# Patient Record
Sex: Female | Born: 1985 | Race: White | Hispanic: No | Marital: Married | Smoking: Never smoker
Health system: Southern US, Community
[De-identification: ages and names within clinical notes are randomized; demographics above are authoritative.]

## PROBLEM LIST (undated history)

## (undated) DIAGNOSIS — F419 Anxiety disorder, unspecified: Secondary | ICD-10-CM

## (undated) DIAGNOSIS — F32A Depression, unspecified: Secondary | ICD-10-CM

## (undated) DIAGNOSIS — Z5189 Encounter for other specified aftercare: Secondary | ICD-10-CM

## (undated) HISTORY — DX: Encounter for other specified aftercare: Z51.89

## (undated) HISTORY — PX: NASAL SINUS SURGERY: SHX719

## (undated) HISTORY — PX: BREAST ENHANCEMENT SURGERY: SHX7

---

## 2015-05-17 DIAGNOSIS — Z3403 Encounter for supervision of normal first pregnancy, third trimester: Secondary | ICD-10-CM | POA: Diagnosis not present

## 2015-05-17 DIAGNOSIS — Z3401 Encounter for supervision of normal first pregnancy, first trimester: Secondary | ICD-10-CM | POA: Diagnosis not present

## 2015-05-17 DIAGNOSIS — Z36 Encounter for antenatal screening of mother: Secondary | ICD-10-CM | POA: Diagnosis not present

## 2015-05-17 DIAGNOSIS — D509 Iron deficiency anemia, unspecified: Secondary | ICD-10-CM | POA: Diagnosis not present

## 2015-05-17 DIAGNOSIS — Z3402 Encounter for supervision of normal first pregnancy, second trimester: Secondary | ICD-10-CM | POA: Diagnosis not present

## 2015-05-17 DIAGNOSIS — Z23 Encounter for immunization: Secondary | ICD-10-CM | POA: Diagnosis not present

## 2015-05-31 ENCOUNTER — Other Ambulatory Visit (HOSPITAL_COMMUNITY): Payer: Self-pay | Admitting: Obstetrics and Gynecology

## 2015-05-31 ENCOUNTER — Encounter (HOSPITAL_COMMUNITY): Payer: Self-pay | Admitting: Obstetrics and Gynecology

## 2015-06-05 ENCOUNTER — Ambulatory Visit (HOSPITAL_COMMUNITY): Payer: Self-pay

## 2015-06-07 ENCOUNTER — Ambulatory Visit (HOSPITAL_COMMUNITY)
Admission: RE | Admit: 2015-06-07 | Discharge: 2015-06-07 | Disposition: A | Discharge status: Home / Self Care | Payer: BLUE CROSS/BLUE SHIELD | Source: Ambulatory Visit | Attending: Obstetrics and Gynecology | Admitting: Obstetrics and Gynecology

## 2015-06-07 ENCOUNTER — Encounter (HOSPITAL_COMMUNITY): Payer: Self-pay

## 2015-06-07 ENCOUNTER — Ambulatory Visit (HOSPITAL_COMMUNITY)
Admission: RE | Admit: 2015-06-07 | Discharge: 2015-06-07 | Disposition: A | Discharge status: Home / Self Care | Payer: BLUE CROSS/BLUE SHIELD | Source: Ambulatory Visit | Attending: Maternal and Fetal Medicine | Admitting: Maternal and Fetal Medicine

## 2015-06-07 DIAGNOSIS — O283 Abnormal ultrasonic finding on antenatal screening of mother: Secondary | ICD-10-CM | POA: Diagnosis not present

## 2015-06-07 DIAGNOSIS — Z315 Encounter for genetic counseling: Secondary | ICD-10-CM | POA: Insufficient documentation

## 2015-06-07 DIAGNOSIS — O09899 Supervision of other high risk pregnancies, unspecified trimester: Secondary | ICD-10-CM | POA: Insufficient documentation

## 2015-06-07 DIAGNOSIS — Z3A14 14 weeks gestation of pregnancy: Secondary | ICD-10-CM | POA: Diagnosis not present

## 2015-06-07 DIAGNOSIS — O280 Abnormal hematological finding on antenatal screening of mother: Secondary | ICD-10-CM | POA: Insufficient documentation

## 2015-06-07 HISTORY — DX: Abnormal hematological finding on antenatal screening of mother: O28.0

## 2015-06-07 HISTORY — DX: Supervision of other high risk pregnancies, unspecified trimester: O09.899

## 2015-06-07 NOTE — Progress Notes (Signed)
Genetic Counseling  High-Risk Gestation Note  Appointment Date:  06/07/2015 Referred By: [name], MD Date of Birth:  08-28-1985 Partner:  Gaetano Net   Pregnancy History: G1P0 Estimated Date of Delivery: 12/02/15 Estimated Gestational Age: [redacted]w[redacted]d Attending: Alpha Gula, MD   Mrs. [name] and her husband, Mr. Azizi Bally, were seen for genetic counseling because of an increased risk for fetal Down syndrome based on first trimester screening through [facility].  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  We reviewed chromosomes, nondisjunction, and the common features and variable prognosis of Down syndrome.  In addition, we reviewed the screen adjusted reduction in risks for trisomy 18/13 (1 in 913  to < 1 in 10,000).  We also discussed other explanations for a screen positive result including: a gestational dating error, differences in maternal metabolism, and normal variation. They understand that this screening is not diagnostic for Down syndrome but provides a risk assessment.  We specifically discussed that the level of one of the proteins analyzed on the screen, PAPP-A, was very low (0.28 MoM).  This has been associated with an increased risk for growth restriction, preeclampsia, poor pregnancy outcome later in pregnancy; therefore, we would recommend a follow up ultrasound for fetal growth in the third trimester.  We reviewed available screening  options including noninvasive prenatal screening (NIPS)/prenatal cell free DNA (cfDNA) testing, and detailed ultrasound.  They were counseled that screening tests are used to modify a patient's a priori risk for aneuploidy, typically based on age. This estimate provides a pregnancy specific risk assessment. We reviewed the benefits and limitations of each option. Specifically, we discussed the conditions for which each test screens, the detection rates, and false positive rates of each. They were also counseled regarding diagnostic testing via amniocentesis. We reviewed the approximate 1 in 300-500 risk for complications for amniocentesis, including spontaneous pregnancy loss.   After consideration of all the options, they elected to proceed with NIPS (Panorama through [facility]) today.  Those results will be available in 8-10 days.  We discussed the option of detailed ultrasound at approximately [redacted] weeks gestation. The patient reported that an anatomy ultrasound is scheduled in her OB office.  Diagnostic testing was declined today.  They understand that screening tests cannot rule out all birth defects or genetic syndromes. The patient was advised of this limitation.   Mrs. [name] was provided with written information regarding cystic fibrosis (CF) including the carrier frequency and incidence in the Caucasian population, the availability of carrier testing and prenatal diagnosis if indicated.  In addition, we discussed that CF is routinely screened for as part of the  newborn screening panel. CF carrier screening was performed through her OB office and was negative for the mutations analyzed. Thus, her risk to be a CF carrier has been significantly reduced.   Both family histories were reviewed and found to be contributory for spina bifida for Mr. Simao's maternal great-uncle. We discussed that spina bifida is a form of  an open neural tube defect (NTD), and affected approximately 1 in  500 births. NTDs occur as an isolated finding, in the majority of cases and are usually inherited in a multifactorial manner in which there is no prior family history.  Multifactorial conditions have both environmental and genetic factors that contribute to their development.  Both the genetic and environmental factors that contribute to the development of spina bifida are largely unknown; however, some medications and health conditions, such as uncontrolled diabetes and obesity, may increase the chance of spina bifida.  We also discussed that NTDs may occur as a feature of an underlying genetic syndrome or condition.  Approximately 5-10% of individuals who have spina bifida also have an underlying chromosome condition.  Without additional information, it is difficult to determine a specific etiology.  However, given the reported family history, recurrence risk for the current pregnancy would not be expected to be increased above the general population risk, in the case of multifactorial inheritance. We reviewed prenatal screening options for detection of NTDs, including MSAFP and ultrasound. Without further information regarding the provided family history, an accurate genetic risk cannot be calculated. Further genetic counseling is warranted if more information is obtained.  Mrs. [name] denied exposure to environmental toxins or chemical agents. She denied the use of alcohol, tobacco or street drugs. She denied significant viral illnesses during the course of her pregnancy. Her medical and surgical histories were contributory for history of blood transfusion as a newborn given her mother's Rh negative blood status.  She reported recent symptoms of UTI and planned to contact her OB provider regarding these concerns.   I counseled this couple for approximately 45 minutes regarding the above risks and available options.   Quinn Plowman, MS,  Certified Genetic Counselor 06/07/2015

## 2015-06-12 ENCOUNTER — Other Ambulatory Visit (HOSPITAL_COMMUNITY): Payer: Self-pay

## 2015-06-12 ENCOUNTER — Encounter (HOSPITAL_COMMUNITY): Payer: Self-pay

## 2015-06-14 ENCOUNTER — Telehealth (HOSPITAL_COMMUNITY): Payer: Self-pay | Admitting: MS"

## 2015-06-14 NOTE — Telephone Encounter (Signed)
Called Kristine Graves to discuss her prenatal cell free DNA test results.  Mrs. Kristine Graves had Panorama testing through [facility].  Testing was offered because of Down syndrome risk from first trimester screen.   The patient was identified by name and DOB.  We reviewed that these are within normal limits, showing a less than 1 in 10,000 risk for trisomies 21, 18 and 13, and monosomy X (Turner syndrome).  In addition, the risk for triploidy/vanishing twin and sex chromosome trisomies (47,XXX and 47,XXY) was also low risk. We reviewed that this testing identifies > 99% of pregnancies with trisomy 68, trisomy 35, sex chromosome trisomies (47,XXX and 47,XXY), and triploidy. The detection rate for trisomy 18 is 96%.  The detection rate for monosomy X is ~92%.  The false positive rate is <0.1% for all conditions. Fetal sex was not disclosed on the results, per the patient's request.  She understands that this testing does not identify all genetic conditions.  All questions were answered to her satisfaction, she was encouraged to call with additional questions or concerns.  Quinn Plowman, MS Certified Genetic Counselor 06/14/2015 9:03 AM

## 2015-06-19 ENCOUNTER — Other Ambulatory Visit (HOSPITAL_COMMUNITY): Payer: Self-pay

## 2015-07-10 ENCOUNTER — Inpatient Hospital Stay (HOSPITAL_COMMUNITY)
Admission: AD | Admit: 2015-07-10 | Payer: BLUE CROSS/BLUE SHIELD | Source: Ambulatory Visit | Admitting: Obstetrics and Gynecology

## 2015-08-30 DIAGNOSIS — Z36 Encounter for antenatal screening of mother: Secondary | ICD-10-CM | POA: Diagnosis not present

## 2015-08-30 DIAGNOSIS — Z23 Encounter for immunization: Secondary | ICD-10-CM | POA: Diagnosis not present

## 2015-08-30 DIAGNOSIS — D509 Iron deficiency anemia, unspecified: Secondary | ICD-10-CM | POA: Diagnosis not present

## 2015-10-04 ENCOUNTER — Ambulatory Visit (INDEPENDENT_AMBULATORY_CARE_PROVIDER_SITE_OTHER): Payer: Self-pay | Admitting: Pediatrics

## 2015-10-04 DIAGNOSIS — Z7681 Expectant parent(s) prebirth pediatrician visit: Secondary | ICD-10-CM

## 2015-10-04 DIAGNOSIS — Z3490 Encounter for supervision of normal pregnancy, unspecified, unspecified trimester: Secondary | ICD-10-CM

## 2015-10-04 NOTE — Progress Notes (Signed)
Prenatal counseling for impending newborn done-- Z76.81  

## 2015-11-01 DIAGNOSIS — Z36 Encounter for antenatal screening of mother: Secondary | ICD-10-CM | POA: Diagnosis not present

## 2015-11-01 DIAGNOSIS — O3663X0 Maternal care for excessive fetal growth, third trimester, not applicable or unspecified: Secondary | ICD-10-CM | POA: Diagnosis not present

## 2015-11-01 DIAGNOSIS — Z3A35 35 weeks gestation of pregnancy: Secondary | ICD-10-CM | POA: Diagnosis not present

## 2015-11-02 ENCOUNTER — Other Ambulatory Visit (HOSPITAL_COMMUNITY): Payer: Self-pay | Admitting: Obstetrics and Gynecology

## 2015-11-02 ENCOUNTER — Ambulatory Visit (HOSPITAL_COMMUNITY)
Admission: RE | Admit: 2015-11-02 | Discharge: 2015-11-02 | Disposition: A | Discharge status: Home / Self Care | Payer: BLUE CROSS/BLUE SHIELD | Source: Ambulatory Visit | Attending: Obstetrics and Gynecology | Admitting: Obstetrics and Gynecology

## 2015-11-02 ENCOUNTER — Encounter (HOSPITAL_COMMUNITY): Payer: Self-pay

## 2015-11-02 VITALS — BP 121/85 | HR 82 | Wt 167.0 lb

## 2015-11-02 DIAGNOSIS — O289 Unspecified abnormal findings on antenatal screening of mother: Secondary | ICD-10-CM | POA: Diagnosis not present

## 2015-11-02 DIAGNOSIS — O359XX0 Maternal care for (suspected) fetal abnormality and damage, unspecified, not applicable or unspecified: Secondary | ICD-10-CM | POA: Insufficient documentation

## 2015-11-02 DIAGNOSIS — Z3A35 35 weeks gestation of pregnancy: Secondary | ICD-10-CM

## 2015-11-02 DIAGNOSIS — O09899 Supervision of other high risk pregnancies, unspecified trimester: Secondary | ICD-10-CM

## 2015-11-02 DIAGNOSIS — Z3689 Encounter for other specified antenatal screening: Secondary | ICD-10-CM

## 2015-11-02 DIAGNOSIS — Z36 Encounter for antenatal screening of mother: Secondary | ICD-10-CM | POA: Diagnosis not present

## 2015-11-02 DIAGNOSIS — O280 Abnormal hematological finding on antenatal screening of mother: Secondary | ICD-10-CM

## 2015-11-07 DIAGNOSIS — Z3A36 36 weeks gestation of pregnancy: Secondary | ICD-10-CM | POA: Diagnosis not present

## 2015-11-07 DIAGNOSIS — O358XX0 Maternal care for other (suspected) fetal abnormality and damage, not applicable or unspecified: Secondary | ICD-10-CM | POA: Diagnosis not present

## 2015-11-14 DIAGNOSIS — O358XX1 Maternal care for other (suspected) fetal abnormality and damage, fetus 1: Secondary | ICD-10-CM | POA: Diagnosis not present

## 2015-11-14 DIAGNOSIS — O289 Unspecified abnormal findings on antenatal screening of mother: Secondary | ICD-10-CM | POA: Diagnosis not present

## 2015-11-14 DIAGNOSIS — O0973 Supervision of high risk pregnancy due to social problems, third trimester: Secondary | ICD-10-CM | POA: Diagnosis not present

## 2015-11-14 DIAGNOSIS — O403XX0 Polyhydramnios, third trimester, not applicable or unspecified: Secondary | ICD-10-CM | POA: Diagnosis not present

## 2015-11-14 DIAGNOSIS — Z3A37 37 weeks gestation of pregnancy: Secondary | ICD-10-CM | POA: Diagnosis not present

## 2015-11-14 DIAGNOSIS — O358XX0 Maternal care for other (suspected) fetal abnormality and damage, not applicable or unspecified: Secondary | ICD-10-CM | POA: Diagnosis not present

## 2015-11-21 ENCOUNTER — Encounter (HOSPITAL_COMMUNITY): Payer: Self-pay

## 2015-11-21 ENCOUNTER — Other Ambulatory Visit (HOSPITAL_COMMUNITY): Payer: Self-pay

## 2015-11-21 DIAGNOSIS — Z3A38 38 weeks gestation of pregnancy: Secondary | ICD-10-CM | POA: Diagnosis not present

## 2015-11-21 DIAGNOSIS — O403XX0 Polyhydramnios, third trimester, not applicable or unspecified: Secondary | ICD-10-CM | POA: Diagnosis not present

## 2015-11-21 DIAGNOSIS — O409XX0 Polyhydramnios, unspecified trimester, not applicable or unspecified: Secondary | ICD-10-CM | POA: Diagnosis not present

## 2015-11-21 DIAGNOSIS — O358XX0 Maternal care for other (suspected) fetal abnormality and damage, not applicable or unspecified: Secondary | ICD-10-CM | POA: Diagnosis not present

## 2015-11-21 DIAGNOSIS — Z3A39 39 weeks gestation of pregnancy: Secondary | ICD-10-CM | POA: Diagnosis not present

## 2015-11-21 DIAGNOSIS — Z36 Encounter for antenatal screening of mother: Secondary | ICD-10-CM | POA: Diagnosis not present

## 2015-11-25 DIAGNOSIS — Q410 Congenital absence, atresia and stenosis of duodenum: Secondary | ICD-10-CM | POA: Diagnosis not present

## 2015-11-25 DIAGNOSIS — O358XX0 Maternal care for other (suspected) fetal abnormality and damage, not applicable or unspecified: Secondary | ICD-10-CM | POA: Diagnosis not present

## 2015-11-25 DIAGNOSIS — Z3403 Encounter for supervision of normal first pregnancy, third trimester: Secondary | ICD-10-CM | POA: Diagnosis not present

## 2015-11-25 DIAGNOSIS — Z3A39 39 weeks gestation of pregnancy: Secondary | ICD-10-CM | POA: Diagnosis not present

## 2015-11-25 DIAGNOSIS — O403XX0 Polyhydramnios, third trimester, not applicable or unspecified: Secondary | ICD-10-CM | POA: Diagnosis not present

## 2015-11-25 DIAGNOSIS — O401XX0 Polyhydramnios, first trimester, not applicable or unspecified: Secondary | ICD-10-CM | POA: Diagnosis not present

## 2015-11-25 DIAGNOSIS — Z3A00 Weeks of gestation of pregnancy not specified: Secondary | ICD-10-CM | POA: Diagnosis not present

## 2015-11-26 DIAGNOSIS — O403XX0 Polyhydramnios, third trimester, not applicable or unspecified: Secondary | ICD-10-CM | POA: Diagnosis not present

## 2015-11-26 DIAGNOSIS — Q410 Congenital absence, atresia and stenosis of duodenum: Secondary | ICD-10-CM | POA: Diagnosis not present

## 2015-11-26 DIAGNOSIS — O401XX0 Polyhydramnios, first trimester, not applicable or unspecified: Secondary | ICD-10-CM | POA: Diagnosis not present

## 2015-11-26 DIAGNOSIS — Z3A39 39 weeks gestation of pregnancy: Secondary | ICD-10-CM | POA: Diagnosis not present

## 2015-12-19 ENCOUNTER — Ambulatory Visit (HOSPITAL_COMMUNITY)
Admission: RE | Admit: 2015-12-19 | Discharge: 2015-12-19 | Disposition: A | Discharge status: Home / Self Care | Payer: BLUE CROSS/BLUE SHIELD | Source: Ambulatory Visit | Attending: Obstetrics and Gynecology | Admitting: Obstetrics and Gynecology

## 2015-12-19 NOTE — Lactation Note (Signed)
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  Mother pumped after birth and baby has been breastfeeding for one week now.  Mother has implants - transaxillary incisions.  Mother's nipples are sore, pink and has a shooting pain from right breast.  Baby did have white coating on tongue.  Referred her to Peds and OB/Gyn for possible thrush treatment and A.P.N.O.  Right breast has crack with dried blood.  Baby has tight labial frenulum contributing to difficulty flanging upper lip.  Also noted that baby tucks bottom lip in when latching.  Taught mother how to flange lips when latching.  Baby latches easily in cross cradle hold.  Helped mother achieve a deeper latch and turn baby toward mother so she feeds tummy to tummy.  Baby transferred approx 84ml in a feeding.  Mother will be going back to work at 12 weeks so suggest she continue pumping once per day or every other day to keep baby use to bottle.  Change breast pads often and gave her information about yeast protocol and treatment.   Consult:  Initial Lactation Consultant:  Hardie PulleyBerkelhammer, Jams Trickett Boschen  ________________________________________________________________________ _______________________________________________________________________  Mother's Name: Kristine SpindleSara Graves Like Type of delivery:   Breastfeeding Experience:  primip  Maternal Medications:  PNV occasionally motrin ________________________________________________________________________  Breastfeeding History (Post Discharge) Frequency of breastfeeding:  6-8 per day Duration of feeding:  15-30 min Pumping  Type of pump:  Medela pump in style Frequency:  1x per day Volume:  2-3 ounces   Infant Intake and Output Assessment  Voids:  3-5 in 24 hrs.  Color:  Clear yellow Stools:  4-5 in 24 hrs.  Color:   Yellow  ________________________________________________________________________  Maternal Breast Assessment  Breast:  Filling Nipple:  Erect Pain level:  1 Pain interventions:  Comfort gels, All purpose nipple cream and Cold packs  _______________________________________________________________________ Feeding Assessment/Evaluation  Initial feeding assessment:  Infant's oral assessment:  Variance  Positioning:  Cross cradle Left breast  LATCH documentation:  Latch:  2 = Grasps breast easily, tongue down, lips flanged, rhythmical sucking.  Audible swallowing:  2 = Spontaneous and intermittent  Type of nipple:  2 = Everted at rest and after stimulation  Comfort (Breast/Nipple):  1 = Filling, red/small blisters or bruises, mild/mod discomfort  Hold (Positioning):  2 = No assistance needed to correctly position infant at breast  LATCH score:  9  Attached assessment:  Shallow  Lips flanged:  Yes.    Lips untucked:  Yes.    Suck assessment:  Nutritive  Tools:  Shells Instructed on use and cleaning of tool:  Yes.    Pre-feed weight:  3318 g   Post-feed weight:  3388 g  Amount transferred:  70 ml  Additional Feeding Assessment -   Infant's oral assessment:  Variance  Positioning:  Cross cradle Right breast  LATCH documentation:  Latch:  2 = Grasps breast easily, tongue down, lips flanged, rhythmical sucking.  Audible swallowing:  1 = A few with stimulation  Type of nipple:  2 = Everted at rest and after stimulation  Comfort (Breast/Nipple):  1 = Filling, red/small blisters or bruises, mild/mod discomfort  Hold (Positioning):  1 = Assistance needed to correctly position infant at breast and maintain latch  LATCH score:  7  Attached assessment:  Shallow  Lips flanged:  Yes.  Lips untucked:  Yes.    Suck assessment:  Displays both  Tools:  Pump Instructed on use and cleaning of tool:  Yes.    Pre-feed weight:  3388 g   Post-feed weight:  3402 g  Amount  transferred:  84 ml  Total amount transferred:  84 ml

## 2015-12-21 DIAGNOSIS — O9213 Cracked nipple associated with lactation: Secondary | ICD-10-CM | POA: Diagnosis not present

## 2016-03-13 DIAGNOSIS — H6692 Otitis media, unspecified, left ear: Secondary | ICD-10-CM | POA: Diagnosis not present

## 2016-03-24 ENCOUNTER — Encounter: Payer: Self-pay | Admitting: Pediatrics

## 2016-04-11 ENCOUNTER — Encounter (HOSPITAL_COMMUNITY): Payer: Self-pay

## 2016-04-25 ENCOUNTER — Ambulatory Visit (HOSPITAL_COMMUNITY)
Admission: RE | Admit: 2016-04-25 | Discharge: 2016-04-25 | Disposition: A | Discharge status: Home / Self Care | Payer: BLUE CROSS/BLUE SHIELD | Source: Ambulatory Visit | Attending: Obstetrics and Gynecology | Admitting: Obstetrics and Gynecology

## 2016-04-25 NOTE — Lactation Note (Signed)
Lactation Consult for Kristine Graves (mother) and Kristine Graves ([birth year])  Mother's reason for visit: milk supply issues & slow weight gain Consult:  Initial Lactation Consultant:  Remigio Eisenmengerichey, Brittnay Pigman Hamilton  ________________________________________________________________________ BW: 6# 13oz 04-24-16: 11# 9 oz Today's weight: pre- & post-weights were not done ____________________________________________________________________  Mother's Name: [name] Richins Breastfeeding Experience: primip Maternal Medical Conditions:  Breast augmentation in 2012 Maternal Medications: fenugreek (therapeutic dosage for 2 weeks, but no help); More Milk Plus Mother's milk tea; Brewer's yeast; OCP (progestin-only) since 6 weeks postpartum; PNV  ________________________________________________________________________  Breastfeeding History (Post Discharge)  Frequency of breastfeeding: tid Duration of feeding: 30-45 min  Pumping  Type of pump:  Medela pump in style Frequency:  6 times/day x 20-30 minutes Volume: 2-5 oz/session   Infant Intake and Output Assessment  Voids: 6-10 in 24 hrs.  Color:  Clear yellow Stools: 0-2 in 24 hrs.  Color:  Green and Yellow  ________________________________________________________________________  Maternal Breast Assessment  Breast:  implants Nipple:  Erect  _______________________________________________________________________ Feeding Assessment/Evaluation  Initial feeding assessment: Not assessed. Infant slept through consult.   Kristine Graves was born at 2439 weeks & is 705 months old. She had surgery for duodenal atresia early in the newborn period, which mother reports was successful. She is at the 1st percentile on the growth curve (at her 2 month check-up she was at the 3rd percentile. There was no weight gain between 172 & 383 months of age).    Mom had implants in 2012. Her anatomy prior to surgery were simply smaller breasts (there was no wide spacing or  asymmetry). Mom reports that her breasts got bigger during pregnancy, but her breast size did not increase by a cup size or more.   Mom has been using size 27 flanges for pumping, which are too large for her. She was sized for flanges, initially trying a 21, which were comfortable (but showed blanching at nipple base). There was considerably less blanching with the size 24 flanges. Mom was taught hand expression so that she can add 2-3 minutes to each breast at the end of pumping to obtain higher-fat milk & increase her volume. Mom's nipples and areola are pink, but Mom states that is typical for her. She does not have complaints of breast pain.   Mom has tried fenugreek at a therapeutic dosage for 2 weeks, but found no change in her supply. Mom might consider an herbal galactagogue like moringa/malunggay. However, mother is amenable to supplementing with formula. Mom has felt a lot of pressure (self-imposed or cultural) to pump/feed a certain amount each day, which is a little less than what Kristine Graves needs for good growth. I suggested that she begin to take formula to daycare so that the daycare providers can offer formula if Mom was unable to pump enough. Beginning to supplement with formula may also allow Mom a longer stretch of sleep at night, as she has been getting up at 3 or 4 am to pump (while Kristine Graves continues to sleep).   Next f/u w/Peds is at the end of January, but Mom states she may come to our Monday-evening BFSG to continue monitoring Audrey's weight.   Glenetta HewKim Ancelmo Hunt, RN, IBCLC

## 2016-05-27 DIAGNOSIS — R6889 Other general symptoms and signs: Secondary | ICD-10-CM | POA: Diagnosis not present

## 2016-09-18 DIAGNOSIS — N926 Irregular menstruation, unspecified: Secondary | ICD-10-CM | POA: Diagnosis not present

## 2016-10-02 DIAGNOSIS — H6690 Otitis media, unspecified, unspecified ear: Secondary | ICD-10-CM | POA: Diagnosis not present

## 2016-10-10 DIAGNOSIS — J0100 Acute maxillary sinusitis, unspecified: Secondary | ICD-10-CM | POA: Diagnosis not present

## 2016-11-06 DIAGNOSIS — J029 Acute pharyngitis, unspecified: Secondary | ICD-10-CM | POA: Diagnosis not present

## 2017-03-18 DIAGNOSIS — Z01419 Encounter for gynecological examination (general) (routine) without abnormal findings: Secondary | ICD-10-CM | POA: Diagnosis not present

## 2017-03-18 DIAGNOSIS — Z6820 Body mass index (BMI) 20.0-20.9, adult: Secondary | ICD-10-CM | POA: Diagnosis not present

## 2017-10-13 DIAGNOSIS — R5383 Other fatigue: Secondary | ICD-10-CM | POA: Diagnosis not present

## 2017-10-13 DIAGNOSIS — G479 Sleep disorder, unspecified: Secondary | ICD-10-CM | POA: Diagnosis not present

## 2017-10-13 DIAGNOSIS — F419 Anxiety disorder, unspecified: Secondary | ICD-10-CM | POA: Diagnosis not present

## 2017-10-13 DIAGNOSIS — F430 Acute stress reaction: Secondary | ICD-10-CM | POA: Diagnosis not present

## 2017-12-04 DIAGNOSIS — N911 Secondary amenorrhea: Secondary | ICD-10-CM | POA: Diagnosis not present

## 2017-12-09 DIAGNOSIS — Z3481 Encounter for supervision of other normal pregnancy, first trimester: Secondary | ICD-10-CM | POA: Diagnosis not present

## 2017-12-09 DIAGNOSIS — Z13228 Encounter for screening for other metabolic disorders: Secondary | ICD-10-CM | POA: Diagnosis not present

## 2017-12-09 DIAGNOSIS — Z3685 Encounter for antenatal screening for Streptococcus B: Secondary | ICD-10-CM | POA: Diagnosis not present

## 2017-12-09 LAB — OB RESULTS CONSOLE RUBELLA ANTIBODY, IGM: Rubella: IMMUNE

## 2017-12-09 LAB — OB RESULTS CONSOLE ANTIBODY SCREEN: Antibody Screen: NEGATIVE

## 2017-12-09 LAB — OB RESULTS CONSOLE HIV ANTIBODY (ROUTINE TESTING): HIV: NONREACTIVE

## 2017-12-09 LAB — OB RESULTS CONSOLE GC/CHLAMYDIA
CHLAMYDIA, DNA PROBE: NEGATIVE
GC PROBE AMP, GENITAL: NEGATIVE

## 2017-12-09 LAB — OB RESULTS CONSOLE RPR: RPR: NONREACTIVE

## 2017-12-09 LAB — OB RESULTS CONSOLE ABO/RH: RH TYPE: POSITIVE

## 2017-12-09 LAB — OB RESULTS CONSOLE HEPATITIS B SURFACE ANTIGEN: Hepatitis B Surface Ag: NEGATIVE

## 2018-01-07 DIAGNOSIS — Z113 Encounter for screening for infections with a predominantly sexual mode of transmission: Secondary | ICD-10-CM | POA: Diagnosis not present

## 2018-01-07 DIAGNOSIS — Z3400 Encounter for supervision of normal first pregnancy, unspecified trimester: Secondary | ICD-10-CM | POA: Diagnosis not present

## 2018-01-14 DIAGNOSIS — Z3682 Encounter for antenatal screening for nuchal translucency: Secondary | ICD-10-CM | POA: Diagnosis not present

## 2018-01-14 DIAGNOSIS — Z3A13 13 weeks gestation of pregnancy: Secondary | ICD-10-CM | POA: Diagnosis not present

## 2018-02-02 DIAGNOSIS — Z3480 Encounter for supervision of other normal pregnancy, unspecified trimester: Secondary | ICD-10-CM | POA: Diagnosis not present

## 2018-02-18 DIAGNOSIS — Z363 Encounter for antenatal screening for malformations: Secondary | ICD-10-CM | POA: Diagnosis not present

## 2018-02-18 DIAGNOSIS — Z23 Encounter for immunization: Secondary | ICD-10-CM | POA: Diagnosis not present

## 2018-03-16 DIAGNOSIS — Z3480 Encounter for supervision of other normal pregnancy, unspecified trimester: Secondary | ICD-10-CM | POA: Diagnosis not present

## 2018-04-14 DIAGNOSIS — Z23 Encounter for immunization: Secondary | ICD-10-CM | POA: Diagnosis not present

## 2018-04-14 DIAGNOSIS — Z3480 Encounter for supervision of other normal pregnancy, unspecified trimester: Secondary | ICD-10-CM | POA: Diagnosis not present

## 2018-04-28 DIAGNOSIS — O26893 Other specified pregnancy related conditions, third trimester: Secondary | ICD-10-CM | POA: Diagnosis not present

## 2018-04-28 DIAGNOSIS — Z3A28 28 weeks gestation of pregnancy: Secondary | ICD-10-CM | POA: Diagnosis not present

## 2018-05-24 DIAGNOSIS — O3663X0 Maternal care for excessive fetal growth, third trimester, not applicable or unspecified: Secondary | ICD-10-CM | POA: Diagnosis not present

## 2018-05-24 DIAGNOSIS — Z3A31 31 weeks gestation of pregnancy: Secondary | ICD-10-CM | POA: Diagnosis not present

## 2018-06-22 DIAGNOSIS — Z3685 Encounter for antenatal screening for Streptococcus B: Secondary | ICD-10-CM | POA: Diagnosis not present

## 2018-07-02 DIAGNOSIS — Z3688 Encounter for antenatal screening for fetal macrosomia: Secondary | ICD-10-CM | POA: Diagnosis not present

## 2018-07-02 DIAGNOSIS — Z3A37 37 weeks gestation of pregnancy: Secondary | ICD-10-CM | POA: Diagnosis not present

## 2018-07-05 ENCOUNTER — Telehealth (HOSPITAL_COMMUNITY): Payer: Self-pay | Admitting: *Deleted

## 2018-07-05 ENCOUNTER — Encounter (HOSPITAL_COMMUNITY): Payer: Self-pay | Admitting: *Deleted

## 2018-07-05 NOTE — Telephone Encounter (Signed)
Preadmission screen  

## 2018-07-12 ENCOUNTER — Telehealth (HOSPITAL_COMMUNITY): Payer: Self-pay | Admitting: *Deleted

## 2018-07-12 ENCOUNTER — Encounter (HOSPITAL_COMMUNITY): Payer: Self-pay | Admitting: *Deleted

## 2018-07-12 NOTE — Telephone Encounter (Signed)
Preadmission screen  

## 2018-07-12 NOTE — H&P (Signed)
Kristine Graves is a 33 y.o. female presenting for IOL.  Pregnancy uncomplicated.  OB History    Gravida  2   Para  1   Term  1   Preterm      AB      Living  1     SAB      TAB      Ectopic      Multiple      Live Births  1          Past Medical History:  Diagnosis Date  . Blood transfusion without reported diagnosis    at birth RH incompatibility   Past Surgical History:  Procedure Laterality Date  . BREAST ENHANCEMENT SURGERY    . NASAL SINUS SURGERY     Family History: family history includes Diabetes in her maternal grandfather and paternal grandfather; Kristine Graves' disease in her maternal grandmother; Heart attack in her paternal grandfather; Hypertension in her father. Social History:  reports that she has never smoked. She has never used smokeless tobacco. She reports previous alcohol use. She reports that she does not use drugs.     Maternal Diabetes: No Genetic Screening: Normal Maternal Ultrasounds/Referrals: Normal Fetal Ultrasounds or other Referrals:  None Maternal Substance Abuse:  No Significant Maternal Medications:  None Significant Maternal Lab Results:  None Other Comments:  None  ROS History   Last menstrual period 10/13/2017, unknown if currently breastfeeding. Exam Physical Exam  Prenatal labs: ABO, Rh: O/Positive/-- (07/31 0000) Antibody: Negative (07/31 0000) Rubella: Immune (07/31 0000) RPR: Nonreactive (07/31 0000)  HBsAg: Negative (07/31 0000)  HIV: Non-reactive (07/31 0000)  GBS:   negative  Assessment/Plan: Admit Pitocin IOL Epidural prn   [name] 07/12/2018, 9:00 PM

## 2018-07-13 ENCOUNTER — Inpatient Hospital Stay (HOSPITAL_COMMUNITY): Payer: BLUE CROSS/BLUE SHIELD | Admitting: Anesthesiology

## 2018-07-13 ENCOUNTER — Inpatient Hospital Stay (HOSPITAL_COMMUNITY)
Admission: RE | Admit: 2018-07-13 | Discharge: 2018-07-14 | DRG: 807 | Disposition: A | Discharge status: Home / Self Care | Payer: BLUE CROSS/BLUE SHIELD | Attending: Obstetrics and Gynecology | Admitting: Obstetrics and Gynecology

## 2018-07-13 ENCOUNTER — Inpatient Hospital Stay (HOSPITAL_COMMUNITY)
Admission: RE | Admit: 2018-07-13 | Discharge: 2018-07-13 | Disposition: A | Discharge status: Home / Self Care | Payer: BLUE CROSS/BLUE SHIELD | Source: Ambulatory Visit | Attending: Obstetrics and Gynecology | Admitting: Obstetrics and Gynecology

## 2018-07-13 ENCOUNTER — Encounter (HOSPITAL_COMMUNITY): Payer: BLUE CROSS/BLUE SHIELD

## 2018-07-13 ENCOUNTER — Encounter (HOSPITAL_COMMUNITY): Payer: Self-pay | Admitting: *Deleted

## 2018-07-13 DIAGNOSIS — Z3A39 39 weeks gestation of pregnancy: Secondary | ICD-10-CM | POA: Diagnosis not present

## 2018-07-13 DIAGNOSIS — Z23 Encounter for immunization: Secondary | ICD-10-CM | POA: Diagnosis not present

## 2018-07-13 DIAGNOSIS — O26893 Other specified pregnancy related conditions, third trimester: Secondary | ICD-10-CM | POA: Diagnosis not present

## 2018-07-13 LAB — CBC
HCT: 37.7 % (ref 36.0–46.0)
Hemoglobin: 12.4 g/dL (ref 12.0–15.0)
MCH: 30.6 pg (ref 26.0–34.0)
MCHC: 32.9 g/dL (ref 30.0–36.0)
MCV: 93.1 fL (ref 80.0–100.0)
Platelets: 141 10*3/uL — ABNORMAL LOW (ref 150–400)
RBC: 4.05 MIL/uL (ref 3.87–5.11)
RDW: 13.1 % (ref 11.5–15.5)
WBC: 6.7 10*3/uL (ref 4.0–10.5)
nRBC: 0 % (ref 0.0–0.2)

## 2018-07-13 LAB — TYPE AND SCREEN
ABO/RH(D): O POS
Antibody Screen: NEGATIVE

## 2018-07-13 LAB — OB RESULTS CONSOLE GBS: GBS: NEGATIVE

## 2018-07-13 LAB — ABO/RH: ABO/RH(D): O POS

## 2018-07-13 LAB — RPR: RPR Ser Ql: NONREACTIVE

## 2018-07-13 MED ORDER — LIDOCAINE HCL (PF) 1 % IJ SOLN
INTRAMUSCULAR | Status: DC | PRN
  Administered 2018-07-13: 5 mL via EPIDURAL
  Administered 2018-07-13: 4 mL via EPIDURAL

## 2018-07-13 MED ORDER — LACTATED RINGERS IV SOLN: 500.0000 mL | Freq: Once | INTRAVENOUS | Status: DC

## 2018-07-13 MED ORDER — OXYTOCIN BOLUS FROM INFUSION
500.0000 mL | Freq: Once | INTRAVENOUS | Status: AC
  Administered 2018-07-13: 500 mL via INTRAVENOUS

## 2018-07-13 MED ORDER — FENTANYL-BUPIVACAINE-NACL 0.5-0.125-0.9 MG/250ML-% EP SOLN
12.0000 mL/h | EPIDURAL | Status: DC | PRN
  Filled 2018-07-13: qty 250

## 2018-07-13 MED ORDER — ACETAMINOPHEN 325 MG PO TABS: 650.0000 mg | ORAL_TABLET | ORAL | Status: DC | PRN

## 2018-07-13 MED ORDER — LACTATED RINGERS IV SOLN
INTRAVENOUS | Status: DC
  Administered 2018-07-13 (×2): via INTRAVENOUS

## 2018-07-13 MED ORDER — MEASLES, MUMPS & RUBELLA VAC IJ SOLR: 0.5000 mL | Freq: Once | INTRAMUSCULAR | Status: DC

## 2018-07-13 MED ORDER — OXYCODONE-ACETAMINOPHEN 5-325 MG PO TABS: 2.0000 | ORAL_TABLET | ORAL | Status: DC | PRN

## 2018-07-13 MED ORDER — TETANUS-DIPHTH-ACELL PERTUSSIS 5-2.5-18.5 LF-MCG/0.5 IM SUSP: 0.5000 mL | Freq: Once | INTRAMUSCULAR | Status: DC

## 2018-07-13 MED ORDER — BENZOCAINE-MENTHOL 20-0.5 % EX AERO
1.0000 "application " | INHALATION_SPRAY | CUTANEOUS | Status: DC | PRN
  Administered 2018-07-13: 1 via TOPICAL
  Filled 2018-07-13: qty 56

## 2018-07-13 MED ORDER — DIPHENHYDRAMINE HCL 25 MG PO CAPS: 25.0000 mg | ORAL_CAPSULE | Freq: Four times a day (QID) | ORAL | Status: DC | PRN

## 2018-07-13 MED ORDER — DIBUCAINE 1 % RE OINT: 1.0000 "application " | TOPICAL_OINTMENT | RECTAL | Status: DC | PRN

## 2018-07-13 MED ORDER — EPHEDRINE 5 MG/ML INJ: 10.0000 mg | INTRAVENOUS | Status: DC | PRN

## 2018-07-13 MED ORDER — SIMETHICONE 80 MG PO CHEW: 80.0000 mg | CHEWABLE_TABLET | ORAL | Status: DC | PRN

## 2018-07-13 MED ORDER — IBUPROFEN 600 MG PO TABS
600.0000 mg | ORAL_TABLET | Freq: Four times a day (QID) | ORAL | Status: DC
  Administered 2018-07-13 – 2018-07-14 (×4): 600 mg via ORAL
  Filled 2018-07-13 (×4): qty 1

## 2018-07-13 MED ORDER — LIDOCAINE HCL (PF) 1 % IJ SOLN: 30.0000 mL | INTRAMUSCULAR | Status: DC | PRN

## 2018-07-13 MED ORDER — SODIUM CHLORIDE (PF) 0.9 % IJ SOLN
INTRAMUSCULAR | Status: DC | PRN
  Administered 2018-07-13: 14 mL/h via EPIDURAL

## 2018-07-13 MED ORDER — OXYTOCIN 40 UNITS IN NORMAL SALINE INFUSION - SIMPLE MED: 2.5000 [IU]/h | INTRAVENOUS | Status: DC

## 2018-07-13 MED ORDER — OXYCODONE-ACETAMINOPHEN 5-325 MG PO TABS: 1.0000 | ORAL_TABLET | ORAL | Status: DC | PRN

## 2018-07-13 MED ORDER — MEDROXYPROGESTERONE ACETATE 150 MG/ML IM SUSP: 150.0000 mg | INTRAMUSCULAR | Status: DC | PRN

## 2018-07-13 MED ORDER — PHENYLEPHRINE 40 MCG/ML (10ML) SYRINGE FOR IV PUSH (FOR BLOOD PRESSURE SUPPORT)
80.0000 ug | PREFILLED_SYRINGE | INTRAVENOUS | Status: DC | PRN
  Filled 2018-07-13: qty 10

## 2018-07-13 MED ORDER — SENNOSIDES-DOCUSATE SODIUM 8.6-50 MG PO TABS
2.0000 | ORAL_TABLET | ORAL | Status: DC
  Administered 2018-07-13: 2 via ORAL
  Filled 2018-07-13: qty 2

## 2018-07-13 MED ORDER — OXYTOCIN 40 UNITS IN NORMAL SALINE INFUSION - SIMPLE MED
1.0000 m[IU]/min | INTRAVENOUS | Status: DC
  Filled 2018-07-13: qty 1000

## 2018-07-13 MED ORDER — ONDANSETRON HCL 4 MG/2ML IJ SOLN: 4.0000 mg | Freq: Four times a day (QID) | INTRAMUSCULAR | Status: DC | PRN

## 2018-07-13 MED ORDER — WITCH HAZEL-GLYCERIN EX PADS: 1.0000 "application " | MEDICATED_PAD | CUTANEOUS | Status: DC | PRN

## 2018-07-13 MED ORDER — ONDANSETRON HCL 4 MG/2ML IJ SOLN: 4.0000 mg | INTRAMUSCULAR | Status: DC | PRN

## 2018-07-13 MED ORDER — LACTATED RINGERS IV SOLN: 500.0000 mL | INTRAVENOUS | Status: DC | PRN

## 2018-07-13 MED ORDER — TERBUTALINE SULFATE 1 MG/ML IJ SOLN: 0.2500 mg | Freq: Once | INTRAMUSCULAR | Status: DC | PRN

## 2018-07-13 MED ORDER — DIPHENHYDRAMINE HCL 50 MG/ML IJ SOLN: 12.5000 mg | INTRAMUSCULAR | Status: DC | PRN

## 2018-07-13 MED ORDER — PRENATAL MULTIVITAMIN CH
1.0000 | ORAL_TABLET | Freq: Every day | ORAL | Status: DC
  Administered 2018-07-14: 1 via ORAL
  Filled 2018-07-13: qty 1

## 2018-07-13 MED ORDER — BUTORPHANOL TARTRATE 1 MG/ML IJ SOLN: 1.0000 mg | INTRAMUSCULAR | Status: DC | PRN

## 2018-07-13 MED ORDER — COCONUT OIL OIL: 1.0000 "application " | TOPICAL_OIL | Status: DC | PRN

## 2018-07-13 MED ORDER — SOD CITRATE-CITRIC ACID 500-334 MG/5ML PO SOLN: 30.0000 mL | ORAL | Status: DC | PRN

## 2018-07-13 MED ORDER — ONDANSETRON HCL 4 MG PO TABS: 4.0000 mg | ORAL_TABLET | ORAL | Status: DC | PRN

## 2018-07-13 MED ORDER — PHENYLEPHRINE 40 MCG/ML (10ML) SYRINGE FOR IV PUSH (FOR BLOOD PRESSURE SUPPORT): 80.0000 ug | PREFILLED_SYRINGE | INTRAVENOUS | Status: DC | PRN

## 2018-07-13 NOTE — Progress Notes (Signed)
Pt comfortable  FHT cat 1 Toco occasional Cvx 5cm AROM - scant fluid  A/P:  Continue pitocin Epidural prn

## 2018-07-13 NOTE — Progress Notes (Signed)
SVD of vigorous female infant w/ apgars of 9,9.  Placenta delivered spontaneous w/ 3VC.   1st degree lac repaired w/ 3-0 vicryl rapide.  Fundus firm.

## 2018-07-13 NOTE — Anesthesia Preprocedure Evaluation (Signed)
Anesthesia Evaluation  Patient identified by MRN, date of birth, ID band Patient awake    Reviewed: Allergy & Precautions, Patient's Chart, lab work & pertinent test results  Airway Mallampati: II  TM Distance: >3 FB Neck ROM: Full    Dental no notable dental hx. (+) Teeth Intact   Pulmonary neg pulmonary ROS,    Pulmonary exam normal breath sounds clear to auscultation       Cardiovascular negative cardio ROS Normal cardiovascular exam Rhythm:Regular Rate:Normal     Neuro/Psych negative neurological ROS  negative psych ROS   GI/Hepatic Neg liver ROS, GERD  ,  Endo/Other  negative endocrine ROS  Renal/GU negative Renal ROS  negative genitourinary   Musculoskeletal   Abdominal   Peds  Hematology negative hematology ROS (+)   Anesthesia Other Findings   Reproductive/Obstetrics (+) Pregnancy                             Anesthesia Physical Anesthesia Plan  ASA: II  Anesthesia Plan: Epidural   Post-op Pain Management:    Induction:   PONV Risk Score and Plan:   Airway Management Planned: Natural Airway  Additional Equipment:   Intra-op Plan:   Post-operative Plan:   Informed Consent: I have reviewed the patients History and Physical, chart, labs and discussed the procedure including the risks, benefits and alternatives for the proposed anesthesia with the patient or authorized representative who has indicated his/her understanding and acceptance.       Plan Discussed with: Anesthesiologist  Anesthesia Plan Comments:         Anesthesia Quick Evaluation

## 2018-07-13 NOTE — Anesthesia Procedure Notes (Signed)
Epidural Patient location during procedure: OB Start time: 07/13/2018 8:58 AM End time: 07/13/2018 9:06 AM  Staffing Anesthesiologist: Mal Amabile, MD Performed: anesthesiologist   Preanesthetic Checklist Completed: patient identified, site marked, surgical consent, pre-op evaluation, timeout performed, IV checked, risks and benefits discussed and monitors and equipment checked  Epidural Patient position: sitting Prep: site prepped and draped and DuraPrep Patient monitoring: continuous pulse ox and blood pressure Approach: midline Location: L3-L4 Injection technique: LOR air  Needle:  Needle type: Tuohy  Needle gauge: 17 G Needle length: 9 cm and 9 Needle insertion depth: 5 cm cm Catheter type: closed end flexible Catheter size: 19 Gauge Catheter at skin depth: 10 cm Test dose: negative and Other  Assessment Events: blood not aspirated, injection not painful, no injection resistance, negative IV test and no paresthesia  Additional Notes Patient identified. Risks and benefits discussed including failed block, incomplete  Pain control, post dural puncture headache, nerve damage, paralysis, blood pressure Changes, nausea, vomiting, reactions to medications-both toxic and allergic and post Partum back pain. All questions were answered. Patient expressed understanding and wished to proceed. Sterile technique was used throughout procedure. Epidural site was Dressed with sterile barrier dressing. No paresthesias, signs of intravascular injection Or signs of intrathecal spread were encountered.  Patient was more comfortable after the epidural was dosed. Please see RN's note for documentation of vital signs and FHR which are stable. Reason for block:procedure for pain

## 2018-07-13 NOTE — Lactation Note (Signed)
This note was copied from a baby's chart. Lactation Consultation Note  Patient Name: Kristine Graves [id number] Date: 07/13/2018 Reason for consult: Initial assessment  Initial visit at 9 hours of life. Mom is a P2 who nursed her 1st child for 6-7 months. Her lactation experience with her 1st child was complicated by that infant needing surgery for duodenal atresia.  Breast augmentation was 8-9 yrs ago, which did not impact her supply with her 1st child.   This infant has already fed 4 times & is currently sleeping deeply. Parents were educated about initial (sleepy) newborn behavior. Mom did complain about nipple pain during the last 2 feedings. Specifics of an asymmetric latch were shown via [employer] so Mom could try that technique the next time infant is awake & ready to feed.   Mom was made aware of O/P services, breastfeeding support groups, & our phone # for post-discharge questions.   Lurline Hare [facility] 07/13/2018, 8:53 PM

## 2018-07-13 NOTE — Anesthesia Postprocedure Evaluation (Signed)
Anesthesia Post Note  Patient: Kristine Graves  Procedure(s) Performed: AN AD HOC LABOR EPIDURAL     Patient location during evaluation: Mother Baby Anesthesia Type: Epidural Level of consciousness: awake Pain management: pain level controlled Vital Signs Assessment: post-procedure vital signs reviewed and stable Respiratory status: spontaneous breathing Cardiovascular status: stable Postop Assessment: patient able to bend at knees, epidural receding, able to ambulate and no headache Anesthetic complications: no    Last Vitals:  Vitals:   07/13/18 1415 07/13/18 1537  BP: 116/86   Pulse: 60   Resp: 18   Temp: (!) 36.4 C 36.6 C  SpO2: 100%     Last Pain:  Vitals:   07/13/18 1729  TempSrc:   PainSc: 3    Pain Goal: Patients Stated Pain Goal: 3 (07/13/18 1729)                 Edison Pace

## 2018-07-14 ENCOUNTER — Inpatient Hospital Stay (HOSPITAL_COMMUNITY): Payer: BLUE CROSS/BLUE SHIELD

## 2018-07-14 LAB — CBC
HCT: 38.1 % (ref 36.0–46.0)
Hemoglobin: 12.4 g/dL (ref 12.0–15.0)
MCH: 30.7 pg (ref 26.0–34.0)
MCHC: 32.5 g/dL (ref 30.0–36.0)
MCV: 94.3 fL (ref 80.0–100.0)
Platelets: 135 10*3/uL — ABNORMAL LOW (ref 150–400)
RBC: 4.04 MIL/uL (ref 3.87–5.11)
RDW: 13 % (ref 11.5–15.5)
WBC: 8.8 10*3/uL (ref 4.0–10.5)
nRBC: 0 % (ref 0.0–0.2)

## 2018-07-14 MED ORDER — LIDOCAINE-EPINEPHRINE (PF) 2 %-1:200000 IJ SOLN
INTRAMUSCULAR | Status: AC
  Filled 2018-07-14: qty 10

## 2018-07-14 NOTE — Lactation Note (Signed)
This note was copied from a baby's chart. Lactation Consultation Note RN reported mom was tearful after feeding d/t pain.  Mom BF her 1st child for 6 months after baby was d/c from NICU. Mom stated it is the same pain as it was with her first child. Mom stated her first child didn't open her mouth wide and her bottom lip needed to be pulled down. Mom stated this baby opens really wide, has no trouble w/her bottom lip. Mom states it looks like baby has a deep latch but doesn't feel like it.  Assessed baby suck w/gloved finger. Noted slight high palate, noted ridge in palate easily felt. Mom states she feels that ridge on her nipple. Baby does have tongue cupped under finger. When cries tongue doesn't raise to palate. LC asked mom if her 1st child had issues w/tongue tie, mom stated no.  Assessed breast. Nipples and areola in cone shape w/nipple boarders not defined until stimulated. Compressible. Red. Comfort gels given. Mom placed on nipples. Hand pump given to pre-pump before latching to assist in everting. Mom states she knows how to use hand pump. Shells given. Mom states she thinks she had those with her first child. Will wear in am. LC asked mom if she ever wore a NS with her first child, mom stated yes some. LC mentioned if the tools I gave isn't helpful and adjusting latch isn't helpful, may try NS later if pain doesn't get better.  Baby sleeping, encouraged mom to rest. Discussed cluster feeding. Mom stated she thinks baby is starting to cluster feed. Encouraged to call for [facility] assist for next feeding if needed.  Patient Name: Kristine Graves Date: 07/14/2018 Reason for consult: Follow-up assessment;Nipple pain/trauma   Maternal Data    Feeding Feeding Type: Breast Fed  LATCH Score Latch: (LC didn't see latch. baby had finished feeding.)     Type of Nipple: Everted at rest and after stimulation(cone shaped nipple and areola. nipple needs stimulation to evert seperate/define  from nipple.)  Comfort (Breast/Nipple): Filling, red/small blisters or bruises, mild/mod discomfort(sore)        Interventions Interventions: Breast feeding basics reviewed;Hand express;Pre-pump if needed;Shells;Comfort gels;Hand pump  Lactation Tools Discussed/Used Tools: Shells;Pump;Comfort gels(to wear shells in am) Shell Type: Inverted Breast pump type: Manual Pump Review: (mom stated she know how to use pump) Initiated by:: Peri Jefferson RN IBCLC Date initiated:: 07/14/18   Consult Status Consult Status: Follow-up Date: 07/14/18 Follow-up type: In-patient(mom has painful latch)    Kristine Graves 07/14/2018, 3:53 AM

## 2018-07-14 NOTE — Discharge Summary (Signed)
Obstetric Discharge Summary Reason for Admission: induction of labor Prenatal Procedures: none Intrapartum Procedures: spontaneous vaginal delivery Postpartum Procedures: none Complications-Operative and Postpartum: 1st degree perineal laceration Hemoglobin  Date Value Ref Range Status  07/14/2018 12.4 12.0 - 15.0 g/dL Final   HCT  Date Value Ref Range Status  07/14/2018 38.1 36.0 - 46.0 % Final    Physical Exam:  General: alert, cooperative and appears stated age 35: appropriate Uterine Fundus: firm Incision: healing well, no significant drainage, no dehiscence, no significant erythema DVT Evaluation: No evidence of DVT seen on physical exam. Negative Homan's sign. No cords or calf tenderness. No significant calf/ankle edema.  Discharge Diagnoses: Term Pregnancy-delivered  Discharge Information: Date: 07/14/2018 Activity: pelvic rest Diet: routine Medications: PNV Condition: stable Instructions: refer to practice specific booklet Discharge to: home   Newborn Data: Live born female  Birth Weight: 8 lb 6.9 oz (3825 g) APGAR: 9, 9  Newborn Delivery   Birth date/time:  07/13/2020 11:16:00 Delivery type:  Vaginal, Spontaneous     Home with mother.  Kristine Graves 07/14/2018, 8:17 AM

## 2018-07-14 NOTE — Lactation Note (Signed)
This note was copied from a baby's chart. Lactation Consultation Note: P2 Experienced BF mom. Is just getting ready to latch baby as I went into room. Reports nipples are sore. They look pink with positional stripe noted. Mom latching baby in cross cradle hold. Reports pain with latch. Bottom lip flanged well. Nipple looks slightly pinched when baby comes off the breast. Suggested trying football hold and mom agreeable. Reports nipple is still tender. Nursed for 15 min. Latched to other breast in football hold. Mom easily able to express Colostrum. Reports this latch feels a little better. Nursed for 25 min total. Comfort gels to nipples after nursing. No questions at present. Reviewed our phone number, OP appointments and BFGS as resources for support after DC. To call prn. Encouraged to make OP appointment if nipples are not improving.   Patient Name: Kristine Graves [id number] Date: 07/14/2018 Reason for consult: Follow-up assessment   Maternal Data Formula Feeding for Exclusion: No Has patient been taught Hand Expression?: Yes  Feeding Feeding Type: Breast Fed  LATCH Score Latch: Grasps breast easily, tongue down, lips flanged, rhythmical sucking.  Audible Swallowing: A few with stimulation  Type of Nipple: Everted at rest and after stimulation  Comfort (Breast/Nipple): Filling, red/small blisters or bruises, mild/mod discomfort  Hold (Positioning): Assistance needed to correctly position infant at breast and maintain latch.  LATCH Score: 7  Interventions Interventions: Breast feeding basics reviewed;Adjust position;Position options;Expressed milk;Hand express;Breast compression  Lactation Tools Discussed/Used Tools: Shells;Comfort gels Shell Type: Inverted WIC Program: No   Consult Status Consult Status: Complete    Pamelia Hoit 07/14/2018, 9:46 AM

## 2018-07-20 ENCOUNTER — Ambulatory Visit: Payer: Self-pay

## 2018-07-20 NOTE — Lactation Note (Signed)
This note was copied from a baby's chart. Lactation Consultation Note  Patient Name: Kristine Graves [id number] Date:[name]/02/2021  Name: Kristine Graves MRN: [id number] Date of Birth: 07/13/2020 Gestational Age: Gestational Age: [redacted]w[redacted]d Birth Weight: 134.9 oz Weight today:    7 pounds 12.8 ounces (3540 grams) with clean newborn diaper  7 day old infant presents today with mom and GM for feeding assessment. Mom is concerned with infant sleepiness and nipple soreness.   Infant has lost 135 grams in the last 6 days. Mom reports last weight was at [facility] office and she was 7 pounds 14 ounces, indicating infant has lost more weight.   Infant is jaundiced in appearance, infant levels have been checked and mom reports she did not hear anything so assumes everything was ok.   Infant is being awakened for feedings at least every 3 hours. She will occasionally cue to feed on her own. Infant is sleepy at the breast and on the bottle. Enc mom to feed all pumped milk back to infant that is pumped. Currently mom is pumping 3 x a day (when giving a bottle to rest nipples and another time during the day). Milk is being used for bottle feeding about 2 x a day and freezing currently. Infant sometimes takes one breast and other times both, she is often too sleepy to take both breasts per mom. Mom does not feel like infant empties the breast. Enc mom to pump to make sure breasts are emptied and to offer milk to infant.   Mom with sore nipples. She reports her nipples have been compressed and asymmetrical post feeding, although that seems to have improved. Nipples are scabbed on the tips. Mom using Coconut Oil and [employer] Nipple Cream which is Lanolin free. Mom using a # 24 Lansinoh Nipple Shield. Gave mom and # 24 NS and made sure she knows how to apply. Enc mom to use as needed and to feed as much without it as you are able.   Infant with thick labial frenulum that inserts at the bottom of the gum  ridge. Upper lip tight with flanging and on the breast. Infant with posterior lingual frenulum noted. Infant with high palate and recessed chin noted. Infant with good tongue extension and lateralization with come decreased mid tongue elevation. Infant with strong suckle on gloved finger with good tongue extension and cupping noted. Mom reports infant takes an Avent Natural Care nipple and has some choking and drooling in the beginning of the feeding. Infant with some drooling on the breast. Infant with intermittent clicking. Nipple is compressed and asymmetrical post feeding. Website Information given on tongue and lip restrictions and Local Providers.   Infant is very sleepy at the breast. Jaundice may be contributing and deficiency in Calories as evidenced by loss of weight in the last 5-6 days. LC feels tongue and lip restrictions may be contributing to   Infant nursed on both breasts. Infant very sleepy on the breast. Infant did transfer 2 ounces at the breast. Mom did well with stimulation to infant.   Infant with some nasal congestion noted. WOB normal. Mom reports she is using saline and Freda to suction nose. congestion did not seem to impact feeding today.   Maternal history of Breast Augmentation due to small breasts. Mom has made an almost full supply with her first child. First child was in the hospital for Duodenal Atresia for 3 weeks and had difficulty with latching. Incisions were in  the axilla area.    Infant to follow up with Dr. Barney Drain on April 7. [facility] have not contacted mom as of yet. LC left message with [facility] and left message to call mom and schedule weight check for later this week. Infant to follow up with Lactation in 1 week.      General Information: Mother's reason for visit: Feeding assessment Consult: Initial Lactation consultant: Noralee Stain RN,IBCLC Breastfeeding experience: Bf every 3 hours, parents awakening for feeding, sleepy at the  breast   Maternal medications: Pre-natal vitamin, Motrin (ibuprofen)  Breastfeeding History: Frequency of breast feeding: every 3 hours, parents awakening Duration of feeding: 20-30 minutes, one or both breasts  Supplementation: Supplement method: bottle(Avent Natural Flow)         Breast milk volume: 2 ounces Breast milk frequency: BID Total breast milk volume per day: 4 ounces Pump type: Medela pump in style(Medela Freestyle) Pump frequency: 3 x a day Pump volume: 2-3 ounces  Infant Output Assessment: Voids per 24 hours: 6 Urine color: Clear yellow Stools per 24 hours: 6 Stool color: Yellow  Breast Assessment: Breast: Filling, Compressible Nipple: Erect, Scabs, Bleeding, Cracked, Reddened Pain level: 6 Pain interventions: Bra, Coconut oil, Other(Nipple Cream)  Feeding Assessment: Infant oral assessment: Variance Infant oral assessment comment: see note Positioning: Cross cradle(left breast, 20 minutes) Latch: 1 - Repeated attempts needed to sustain latch, nipple held in mouth throughout feeding, stimulation needed to elicit sucking reflex. Audible swallowing: 1 - A few with stimulation Type of nipple: 2 - Everted at rest and after stimulation Comfort: 1 - Filling, red/small blisters or bruises, mild/mod discomfort Hold: 1 - Assistance needed to correctly position infant at breast and maintain latch LATCH score: 6 Latch assessment: Deep Lips flanged: No(upper and lower lip needs flanging) Suck assessment: Displays both   Pre-feed weight: 3540 grams Post feed weight: 3574 grams Amount transferred: 34 ml Amount supplemented: 0  Additional Feeding Assessment: Infant oral assessment: Variance Infant oral assessment comment: see noted Positioning: Cross cradle(right breast, 20 minutes) Latch: 1 - Repeated attempts neede to sustain latch, nipple held in mouth throughout feeding, stimulation needed to elicit sucking reflex. Audible swallowing: 1 - A few with  stimulation Type of nipple: 2 - Everted at rest and after stimulation Comfort: 1 - Filling, red/small blisters or bruises, mild/mod discomfort Hold: 2 - No assistance needed to correctly position infant at breast LATCH score: 7 Latch assessment: Deep Lips flanged: No(upper and lower lip needs flanging) Suck assessment: Displays both   Pre-feed weight: 3574 grams Post feed weight: 3606 grams Amount transferred: 30 ml Amount supplemented: 0  Totals: Total amount transferred: 64 ml Total supplement given: 0 Total amount pumped post feed: dis not pump   Plan:  1. Feed infant with feeding cues, make sure infant gets 8 feedings in 24 hours. Awaken infant as needed for feeding Limit feedings to 20 minutes at the breast and then offer the bottle if infant is sleepy at the breast 2. Use the #24 Nipple Shield with feeding as needed for nipple pain 3. Keep infant awake as needed with feeding, may be helpful to feed infant Skin to Skin with feedings 4. Massage/compress breasts with feedings 5. Offer infant a bottle of pumped milk after breast feeding if she is still cueing to feed. Feed all milk that is pumped back to infant. Start with supplementing when infant chooses to take only one breast and if still cueing to feed. Start with one ounce and increase as  infant wants.  6. Infant needs about 65-88 ml (2.5-3 ounces) for 8 feedings a day or 525-700 ml (18-23 ounces) in 24 hours. Infant may take more or less depending on how often she feeds. Feed infant until she is satisfied.  7. Continue using the Avent Natural Flow nipple 8. Feed infant using the Paced bottle feeding method to feed infant (video on [email or web address]) 9. Would recommend that you pump each time infant only takes one breast with a goal of at least 4-6 x a day. Pump for about 20 minutes with double pump.  10. Call [facility] if you have not heard from them to set up a weight check. [phone] 11. Keep up the good work 12.  Thank you for allowing me to assist you today 13. Please call with any questions/concerns as needed [phone] 14. Follow up with Lactation in 1 week  Ed Blalock RN, IBCLC                                                      Ed Blalock 07/20/2018, 8:41 AM

## 2018-07-27 ENCOUNTER — Ambulatory Visit: Payer: Self-pay

## 2018-07-27 DIAGNOSIS — R633 Feeding difficulties: Secondary | ICD-10-CM | POA: Diagnosis not present

## 2018-07-27 NOTE — Lactation Note (Signed)
This note was copied from a baby's chart. Lactation Consultation Note  Patient Name: Kristine Graves [id number]'[id number] Date: 07/27/2018   07/27/2020  Name: Kristine Graves MRN: [id number] Date of Birth: 07/13/2020 Gestational Age: Gestational Age: [redacted]w[redacted]d Birth Weight: 134.9 oz Weight today:    8 pounds 3.8 ounces (3736 grams) with clean newborn diaper  9 week old infant presents today with mom for follow up feeding assessment. Mom reports her nipples have healed. She reports infant is BF during the day and she is pumping and bottle feeding at night. Mom is no longer using the NS with feedings.   Infant has gained 196 grams in the last 7 days with an average daily weight gain of 28 grams a day.   Infant is feeding every 3 hours during the day and every 4 hours at night. Mom typically has to awaken infant to feed. Infant is still sleepy with breast and bottle per mom.   Infant is BF on both breasts for about 15 minutes each. Mom reports her nipples are healed and that they are still asymmetrical post feeding.   Infant is bottle feeding about 6 x a day after BF during the day and in place of BF at night. Sometimes infant refuses the bottle.   Mom is pumping 5-6 x a day and getting 3-4 ounces per pumping. Mom has enough milk to feed infant and is able to store some milk for later.   Infant clicking intermittently on the breast today. Infant sleepy at the breast, mom is stimulating infant as needed with feeding. Infant fed needing some stimulation for feeding. Nipple slightly compressed post feeding. Mom noted some pinching to the underside of the left breast with feeding. Mom reports the left breast is always more painful.   Infant with thick labial frenulum noted, upper lip tight with flanging and on the breast. Infant with posterior lingual frenulum noted. Infant with hump to back of tongue with suckling. Infant with good tongue extension and cupping on gloved finger. Infant with recessed chin and  high palate. Mom's nipple compressed and asymmetrical post feeding. Mom reports some choking and drooling on the bottle. Infant clicking on the breast at times. Infant sleepy at the breast with feeding, mom did well with stimulating infant. Mom reports they have researched tongue and lip restrictions and have not made a decision if they want to have infant evaluated at this time.    Discussed how to treat plugged ducts and discussed Sunflower Lecithin may be helpful.   Infant is feeding better at the breast, although not transferring full feedings. She seems to have improved with feedings in the last week.   Infant to follow up with Dr. Barney Drain the first week of April. [facility] is not scheduled to come back out. Infant to follow up with Lactation as needed or 1-5 days post tongue and lip releases if completed.    General Information: Mother's reason for visit: Follow up feeding assessment Consult: Follow-up Lactation consultant: Jasmine December Fiorella Hanahan RN,IBCLC Breastfeeding experience: BF every 3 hours during the day and every 4 hours at night, mom is waking with most feedings Maternal medical conditions: Breast augmentation(Scars under armpit per mom) Maternal medications: Pre-natal vitamin, Motrin (ibuprofen)  Breastfeeding History: Frequency of breast feeding: every 3 hours during the day, every 4 hous at night, parents are awakening Duration of feeding: 20-30 minutes, usually splits time between breasts  Supplementation: Supplement method: bottle(Avent Natural flow)         Breast  milk volume: 1-1.5 ounces TID after breast feeding, 3-3.5 ounces TID if not breast feeding Breast milk frequency: 6 x a day   Pump type: Medela pump in style(has Medela Freestyle also) Pump frequency: 5 x a day Pump volume: 3-4 ounces  Infant Output Assessment: Voids per 24 hours: 7 ounces Urine color: Clear yellow Stools per 24 hours: 5  Stool color: Yellow  Breast Assessment: Breast: Filling,  Compressible Nipple: Erect Pain level: 3 Pain interventions: Bra, Coconut oil, Other(Nipple Butter)  Feeding Assessment: Infant oral assessment: Variance Infant oral assessment comment: see note Positioning: Cross cradle(right breast, 15 minutes) Latch: 2 - Grasps breast easily, tongue down, lips flanged, rhythmical sucking. Audible swallowing: 2 - Spontaneous and intermittent Type of nipple: 2 - Everted at rest and after stimulation Comfort: 1 - Filling, red/small blisters or bruises, mild/mod discomfort Hold: 2 - No assistance needed to correctly position infant at breast LATCH score: 9 Latch assessment: Deep Lips flanged: No(mom flanges upper and lower lip post latch) Suck assessment: Displays both   Pre-feed weight: 3736 grams Post feed weight: 3766 grams Amount transferred: 30 ml Amount supplemented: 0  Additional Feeding Assessment: Infant oral assessment: Variance Infant oral assessment comment: see note Positioning: Cross cradle(left breast, 10 minutes) Latch: 1 - Repeated attempts neede to sustain latch, nipple held in mouth throughout feeding, stimulation needed to elicit sucking reflex. Audible swallowing: 1 - A few with stimulation Type of nipple: 2 - Everted at rest and after stimulation Comfort: 1 - Filling, red/small blisters or bruises, mild/mod discomfort Hold: 2 - No assistance needed to correctly position infant at breast LATCH score: 7 Latch assessment: Deep Lips flanged: No(mom flanges upper and lower lip post feeding) Suck assessment: Displays both   Pre-feed weight: 3766 grams Post feed weight: 3776 grams Amount transferred: 10 ml Amount supplemented: mom to supplement at home  Totals: Total amount transferred: 40 ml Total supplement given: mom to supplement at home Total amount pumped post feed: did not pump   Plan:  1. Feed infant with feeding cues, make sure infant gets 8 feedings in 24 hours. Awaken infant as needed for feeding Limit  feedings to 20 minutes at the breast and then offer the bottle if infant is sleepy at the breast 3. Keep infant awake as needed with feeding, may be helpful to feed infant Skin to Skin with feedings 4. Massage/compress breasts with feedings 5. Offer infant a bottle of pumped milk after breast feeding if she is still cueing to feed. Feed all milk that is pumped back to infant. Start with supplementing when infant chooses to take only one breast and if still cueing to feed. Start with one ounce and increase as infant wants.  6. Infant needs about 69-93 ml (2.5-3 ounces) for 8 feedings a day or 555-740 ml (19-25 ounces) in 24 hours. Infant may take more or less depending on how often she feeds. Feed infant until she is satisfied.  7. Continue using the Avent Natural Flow nipple 8. Feed infant using the Paced bottle feeding method to feed infant (video on [email or web address]) 9. Would recommend that you pump each time infant only takes one breast with a goal of at least 4-6 x a day. Pump for about 20 minutes with double pump. When infant has a tongue and lip restriction pumping 3-4 times a day is recommended to protect milk supply.  10. If continue to have plugged ducts, Sunflower Lecithin may be helpful. The usual dosage is 1200 mg capsules 4 x  a day ( breakfast, lunch, dinner, bedtime). Can decrease to 2 x a day once plugs have resolved.  11. Keep up the good work 12. Thank you for allowing me to assist you today 13. Please call with any questions/concerns as needed [phone] 14. Infant to follow up with Lactation as needed or 1-5 days post tongue and lip releases if completed.     Silas Flood Clarice Zulauf RN, IBCLC                                                     Silas Flood Yeny Schmoll 07/27/2018, 11:53 AM

## 2018-08-19 DIAGNOSIS — Z1389 Encounter for screening for other disorder: Secondary | ICD-10-CM | POA: Diagnosis not present

## 2018-08-31 ENCOUNTER — Ambulatory Visit: Payer: Self-pay

## 2018-08-31 NOTE — Lactation Note (Signed)
This note was copied from a baby's chart. Lactation Consultation Note  Patient Name: Kristine Graves ZOXWR'UToday's Date: 08/31/2018   08/31/2018  Name: Kristine HartiganSara Noelle Nofziger MRN: [id number] Date of Birth: 07/13/2020 Gestational Age: Gestational Age: 8375w0d Birth Weight: 134.9 oz Weight today:    10 pounds 8.2 ounces (4770 Grams) with clean size 911 diaper  167 week old infant presents today with mom for follow up feeding assessment. Mom reports infant is spitting with about 75% of her feedings. Spits up are smaller to larger.She may spit soon after feeding or may have a period in between. Mom reports looks like breast milk with no off color to it. Infant typically does not spit at night. Mom reports infant has periods of crying in the evening for up to 2 hours at a time, she does not do this every night. Discussed purple crying. Discussed laying infant down and walking away as needed. Mom reports she has help from dad as needed. She has no other signs of illness. Enc mom to report crying to pediatrician. Mom reports crying and spitting has increased in the last week.   Infant has gained 1034 grams in the last 35 days with an average daily weight gain of 30 grams a day.   Infant has weaned down to 6 feedings in 24 hours. She is feeding every 4 hours at night. Mom has stopped pumping in the last few days. She has plenty of milk in the freezer at this time. Infant is not getting bottles routinely. Enc mom to give infant a bottle a few times a week so she will be more likely to take them.   Infant is self awakening to feed about 6 x a day and feeding on both breasts with each feeding. Mom has infant empty the first breast before offering the second breast.   Infant with thick labial frenulum noted. Upper lip tight with flanging. Infant with high palate and recessed chin. Infant with posterior lingual frenulum noted. Infant with good tongue extension and lateralization and strong suckle on gloved finger. Infant with  decreased mid tongue elevation. Infant with hump to back of tongue when suckling on gloved finger. Infant clicks throughout feeding. Infant with jaw quivering post feeding. Mom still with nipple pain with feedings and nipple compression post feedings. Mom feels like it is too late to have her tongue and lip released if needed, discussed it is ok to do at a later time if needed. Discussed that tongue restriction can be contributing to spitting also. Discussed Dr. Orland MustardMcMurtry is still seeing pts for tongue and lip restrictions if needed. Mom has provider information at home.   Mom with some plugged ducts. She is able to get them resolved easily and is not taking the Sunflower Lecithin.  Infant latched to the left breast in the cross cradle hold. Infant fed for about 15 minutes and transferred 66 ml. Infant then burped with a small spit up and then latched to the left breast. She fed for about 10 minutes and transferred 36 ml. Infant stooled large yellow stools post feeding.   Infant to follow up with Pediatrician the first week of May for 2 month check up. Infant to follow up with Lactation as needed or 1-5 days post tongue and lip releases if completed.     General Information: Mother's reason for visit: Follow up feeding assessment, spitting, crying in the evening Consult: Follow-up Lactation consultant: Noralee StainSharon Daisey Caloca RN,IBCLC Breastfeeding experience: BF about 6 x a day, moth  sides, self awakens Maternal medical conditions: Breast augmentation Maternal medications: Pre-natal vitamin, Other(Claritin)  Breastfeeding History: Frequency of breast feeding: 6 x a day Duration of feeding: 20-30 minutes  Supplementation: Supplement method: (no bottles recently)               Pump type: Medela pump in style(Has Freestyle also)      Infant Output Assessment: Voids per 24 hours: 7 Urine color: Clear yellow Stools per 24 hours: 1 Stool color: Yellow  Breast Assessment: Breast: Filling,  Compressible Nipple: Erect Pain level: 4(more intense with latch, improves some) Pain interventions: Bra, Other, Expressed breast milk(nipple butter)  Feeding Assessment: Infant oral assessment: Variance Infant oral assessment comment: see note Positioning: Cross cradle(right breast, 15 minutes) Latch: 2 - Grasps breast easily, tongue down, lips flanged, rhythmical sucking. Audible swallowing: 2 - Spontaneous and intermittent Type of nipple: 2 - Everted at rest and after stimulation Comfort: 1 - Filling, red/small blisters or bruises, mild/mod discomfort Hold: 2 - No assistance needed to correctly position infant at breast LATCH score: 9 Latch assessment: Deep Lips flanged: Yes Suck assessment: Displays both   Pre-feed weight: 4770 grams Post feed weight: 4836 grams Amount transferred: 66 ml Amount supplemented: 0  Additional Feeding Assessment: Infant oral assessment: Variance Infant oral assessment comment: see note Positioning: Cross cradle(left breast, 10 minutes) Latch: 2 - Grasps breast easily, tongue down, lips flanged, rhythmical sucking. Audible swallowing: 2 - Spontaneous and intermittent Type of nipple: 2 - Everted at rest and after stimulation Comfort: 1 - Filling, red/small blisters or bruises, mild/mod discomfort Hold: 2 - No assistance needed to correctly position infant at breast LATCH score: 9 Latch assessment: Deep Lips flanged: Yes Suck assessment: Displays both   Pre-feed weight: 4836 grams Post feed weight: 4872 grams Amount transferred: 36 ml Amount supplemented: 0  Totals: Total amount transferred: 102 ml Total supplement given: 0 Total amount pumped post feed: did not pump   Plan:  1. Feed infant with feeding cues, allow her to feed as long as she wants 2. Offer infant a bottle of pumped milk after breast feeding if she is still cueing to feed. Feed all milk that is pumped back to infant. Start with supplementing when infant chooses to take  only one breast and if still cueing to feed. Start with one ounce and increase as infant wants.  3. Infant needs about 88-118 ml (3-4 ounces) for 8 feedings a day or 705-940 ml (24-30 ounces) in 24 hours. Infant may take more or less depending on how often she feeds. Feed infant until she is satisfied.  4. Burp infant frequently with feedings 5. Keep infant upright for about 20 minutes after feedings before laying down 6.When infant has a tongue and lip restriction pumping 3-4 times a day is recommended to protect milk sup 7. If continue to have plugged ducts, Sunflower Lecithin may be helpful. The usual dosage is 1200 mg capsules 4 x a day ( breakfast, lunch, dinner, bedtime). Can decrease to 2 x a day once plugs have resolved.  8. Keep up the good work 9. Thank you for allowing me to assist you today 10. Please call with any questions/concerns as needed [phone] 11. Infant to follow up with Lactation as needed or 1-5 days post tongue and lip releases if completed.   Ed Blalock RN, IBCLC  Silas Flood Akasha Melena 08/31/2018, 11:27 AM

## 2018-09-02 DIAGNOSIS — Z30430 Encounter for insertion of intrauterine contraceptive device: Secondary | ICD-10-CM | POA: Diagnosis not present

## 2018-09-14 DIAGNOSIS — D225 Melanocytic nevi of trunk: Secondary | ICD-10-CM | POA: Diagnosis not present

## 2018-09-14 DIAGNOSIS — L821 Other seborrheic keratosis: Secondary | ICD-10-CM | POA: Diagnosis not present

## 2018-10-13 DIAGNOSIS — Z30431 Encounter for routine checking of intrauterine contraceptive device: Secondary | ICD-10-CM | POA: Diagnosis not present

## 2018-12-15 DIAGNOSIS — D225 Melanocytic nevi of trunk: Secondary | ICD-10-CM | POA: Diagnosis not present

## 2019-02-10 DIAGNOSIS — L219 Seborrheic dermatitis, unspecified: Secondary | ICD-10-CM | POA: Diagnosis not present

## 2019-02-10 DIAGNOSIS — L821 Other seborrheic keratosis: Secondary | ICD-10-CM | POA: Diagnosis not present

## 2019-02-10 DIAGNOSIS — L814 Other melanin hyperpigmentation: Secondary | ICD-10-CM | POA: Diagnosis not present

## 2019-02-10 DIAGNOSIS — D225 Melanocytic nevi of trunk: Secondary | ICD-10-CM | POA: Diagnosis not present

## 2019-03-23 ENCOUNTER — Other Ambulatory Visit: Payer: Self-pay

## 2019-03-23 DIAGNOSIS — K624 Stenosis of anus and rectum: Secondary | ICD-10-CM

## 2019-03-25 LAB — NOVEL CORONAVIRUS, NAA: SARS-CoV-2, NAA: NOT DETECTED

## 2019-04-04 DIAGNOSIS — F411 Generalized anxiety disorder: Secondary | ICD-10-CM | POA: Diagnosis not present

## 2019-06-09 ENCOUNTER — Emergency Department (HOSPITAL_COMMUNITY): Payer: BLUE CROSS/BLUE SHIELD | Admitting: Certified Registered"

## 2019-06-09 ENCOUNTER — Observation Stay (HOSPITAL_COMMUNITY)
Admission: EM | Admit: 2019-06-09 | Discharge: 2019-06-10 | Disposition: A | Discharge status: Home / Self Care | Payer: BLUE CROSS/BLUE SHIELD | Attending: General Surgery | Admitting: General Surgery

## 2019-06-09 ENCOUNTER — Other Ambulatory Visit: Payer: Self-pay

## 2019-06-09 ENCOUNTER — Other Ambulatory Visit: Payer: Self-pay | Admitting: Family Medicine

## 2019-06-09 ENCOUNTER — Inpatient Hospital Stay: Admit: 2019-06-09 | Payer: BLUE CROSS/BLUE SHIELD | Admitting: General Surgery

## 2019-06-09 ENCOUNTER — Encounter (HOSPITAL_COMMUNITY): Payer: Self-pay | Admitting: Emergency Medicine

## 2019-06-09 ENCOUNTER — Ambulatory Visit
Admission: RE | Admit: 2019-06-09 | Discharge: 2019-06-09 | Disposition: A | Discharge status: Home / Self Care | Payer: Self-pay | Source: Ambulatory Visit | Attending: Family Medicine | Admitting: Family Medicine

## 2019-06-09 ENCOUNTER — Encounter (HOSPITAL_COMMUNITY)
Admission: EM | Disposition: A | Discharge status: Home / Self Care | Payer: Self-pay | Source: Home / Self Care | Attending: Emergency Medicine

## 2019-06-09 DIAGNOSIS — Z20822 Contact with and (suspected) exposure to covid-19: Secondary | ICD-10-CM | POA: Diagnosis not present

## 2019-06-09 DIAGNOSIS — K921 Melena: Secondary | ICD-10-CM

## 2019-06-09 DIAGNOSIS — G8929 Other chronic pain: Secondary | ICD-10-CM

## 2019-06-09 DIAGNOSIS — R531 Weakness: Secondary | ICD-10-CM | POA: Diagnosis not present

## 2019-06-09 DIAGNOSIS — K3580 Unspecified acute appendicitis: Secondary | ICD-10-CM | POA: Diagnosis not present

## 2019-06-09 DIAGNOSIS — R1031 Right lower quadrant pain: Secondary | ICD-10-CM | POA: Diagnosis not present

## 2019-06-09 DIAGNOSIS — K769 Liver disease, unspecified: Secondary | ICD-10-CM | POA: Diagnosis not present

## 2019-06-09 DIAGNOSIS — R109 Unspecified abdominal pain: Secondary | ICD-10-CM | POA: Diagnosis not present

## 2019-06-09 DIAGNOSIS — R1084 Generalized abdominal pain: Secondary | ICD-10-CM

## 2019-06-09 HISTORY — DX: Unspecified acute appendicitis: K35.80

## 2019-06-09 HISTORY — PX: LAPAROSCOPIC APPENDECTOMY: SHX408

## 2019-06-09 LAB — CBC
HCT: 41 % (ref 36.0–46.0)
Hemoglobin: 13.3 g/dL (ref 12.0–15.0)
MCH: 31.4 pg (ref 26.0–34.0)
MCHC: 32.4 g/dL (ref 30.0–36.0)
MCV: 96.7 fL (ref 80.0–100.0)
Platelets: 157 10*3/uL (ref 150–400)
RBC: 4.24 MIL/uL (ref 3.87–5.11)
RDW: 12.8 % (ref 11.5–15.5)
WBC: 5.9 10*3/uL (ref 4.0–10.5)
nRBC: 0 % (ref 0.0–0.2)

## 2019-06-09 LAB — RESPIRATORY PANEL BY RT PCR (FLU A&B, COVID)
Influenza A by PCR: NEGATIVE
Influenza B by PCR: NEGATIVE
SARS Coronavirus 2 by RT PCR: NEGATIVE

## 2019-06-09 LAB — I-STAT BETA HCG BLOOD, ED (MC, WL, AP ONLY): I-stat hCG, quantitative: 5.3 m[IU]/mL — ABNORMAL HIGH (ref <5)

## 2019-06-09 LAB — BASIC METABOLIC PANEL
Anion gap: 9 (ref 5–15)
BUN: 9 mg/dL (ref 6–20)
CO2: 22 mmol/L (ref 22–32)
Calcium: 9.2 mg/dL (ref 8.9–10.3)
Chloride: 105 mmol/L (ref 98–111)
Creatinine, Ser: 0.67 mg/dL (ref 0.44–1.00)
GFR calc Af Amer: >60 mL/min (ref >60)
GFR calc non Af Amer: >60 mL/min (ref >60)
Glucose, Bld: 88 mg/dL (ref 70–99)
Potassium: 3.7 mmol/L (ref 3.5–5.1)
Sodium: 136 mmol/L (ref 135–145)

## 2019-06-09 SURGERY — APPENDECTOMY, LAPAROSCOPIC
Anesthesia: General

## 2019-06-09 MED ORDER — PHENYLEPHRINE HCL (PRESSORS) 10 MG/ML IV SOLN
INTRAVENOUS | Status: DC | PRN
  Administered 2019-06-09: 80 ug via INTRAVENOUS

## 2019-06-09 MED ORDER — SODIUM CHLORIDE 0.9 % IV BOLUS
1000.0000 mL | Freq: Once | INTRAVENOUS | Status: AC
  Administered 2019-06-09: 1000 mL via INTRAVENOUS

## 2019-06-09 MED ORDER — LIDOCAINE HCL (CARDIAC) PF 100 MG/5ML IV SOSY
PREFILLED_SYRINGE | INTRAVENOUS | Status: DC | PRN
  Administered 2019-06-09: 50 mg via INTRATRACHEAL

## 2019-06-09 MED ORDER — MIDAZOLAM HCL 5 MG/5ML IJ SOLN: INTRAMUSCULAR | Status: DC | PRN

## 2019-06-09 MED ORDER — OXYCODONE HCL 5 MG PO TABS: 5.0000 mg | ORAL_TABLET | Freq: Once | ORAL | Status: DC | PRN

## 2019-06-09 MED ORDER — BUPIVACAINE-EPINEPHRINE 0.25% -1:200000 IJ SOLN
INTRAMUSCULAR | Status: DC | PRN
  Administered 2019-06-09: 30 mL

## 2019-06-09 MED ORDER — SUGAMMADEX SODIUM 200 MG/2ML IV SOLN
INTRAVENOUS | Status: DC | PRN
  Administered 2019-06-09: 200 mg via INTRAVENOUS

## 2019-06-09 MED ORDER — 0.9 % SODIUM CHLORIDE (POUR BTL) OPTIME
TOPICAL | Status: DC | PRN
  Administered 2019-06-09: 1000 mL

## 2019-06-09 MED ORDER — SODIUM CHLORIDE 0.9 % IV SOLN
2.0000 g | Freq: Once | INTRAVENOUS | Status: AC
  Administered 2019-06-09: 2 g via INTRAVENOUS
  Filled 2019-06-09: qty 20

## 2019-06-09 MED ORDER — ACETAMINOPHEN 160 MG/5ML PO SOLN: 1000.0000 mg | Freq: Once | ORAL | Status: DC | PRN

## 2019-06-09 MED ORDER — PANTOPRAZOLE SODIUM 40 MG IV SOLR
40.0000 mg | Freq: Every day | INTRAVENOUS | Status: DC
  Administered 2019-06-09: 40 mg via INTRAVENOUS
  Filled 2019-06-09: qty 40

## 2019-06-09 MED ORDER — MIDAZOLAM HCL 2 MG/2ML IJ SOLN
INTRAMUSCULAR | Status: AC
  Filled 2019-06-09: qty 2

## 2019-06-09 MED ORDER — GABAPENTIN 300 MG PO CAPS
300.0000 mg | ORAL_CAPSULE | ORAL | Status: AC
  Administered 2019-06-09: 300 mg via ORAL
  Filled 2019-06-09: qty 1

## 2019-06-09 MED ORDER — PROPOFOL 10 MG/ML IV BOLUS
INTRAVENOUS | Status: DC | PRN
  Administered 2019-06-09: 130 mg via INTRAVENOUS

## 2019-06-09 MED ORDER — OXYCODONE HCL 5 MG/5ML PO SOLN: 5.0000 mg | Freq: Once | ORAL | Status: DC | PRN

## 2019-06-09 MED ORDER — ACETAMINOPHEN 500 MG PO TABS
1000.0000 mg | ORAL_TABLET | Freq: Four times a day (QID) | ORAL | Status: DC
  Administered 2019-06-09 – 2019-06-10 (×2): 1000 mg via ORAL
  Filled 2019-06-09 (×2): qty 2

## 2019-06-09 MED ORDER — DEXAMETHASONE SODIUM PHOSPHATE 4 MG/ML IJ SOLN
INTRAMUSCULAR | Status: DC | PRN
  Administered 2019-06-09: 5 mg via INTRAVENOUS

## 2019-06-09 MED ORDER — IOPAMIDOL (ISOVUE-300) INJECTION 61%
100.0000 mL | Freq: Once | INTRAVENOUS | Status: AC | PRN
  Administered 2019-06-09: 15:00:00 100 mL via INTRAVENOUS

## 2019-06-09 MED ORDER — ZOLPIDEM TARTRATE 5 MG PO TABS: 5.0000 mg | ORAL_TABLET | Freq: Every evening | ORAL | Status: DC | PRN

## 2019-06-09 MED ORDER — LACTATED RINGERS IV SOLN: INTRAVENOUS | Status: DC | PRN

## 2019-06-09 MED ORDER — KETOROLAC TROMETHAMINE 30 MG/ML IJ SOLN
INTRAMUSCULAR | Status: DC | PRN
  Administered 2019-06-09: 30 mg via INTRAVENOUS

## 2019-06-09 MED ORDER — OXYCODONE HCL 5 MG PO TABS
5.0000 mg | ORAL_TABLET | ORAL | Status: DC | PRN
  Administered 2019-06-10: 5 mg via ORAL
  Filled 2019-06-09: qty 1

## 2019-06-09 MED ORDER — FENTANYL CITRATE (PF) 100 MCG/2ML IJ SOLN
INTRAMUSCULAR | Status: AC
  Filled 2019-06-09: qty 2

## 2019-06-09 MED ORDER — FENTANYL CITRATE (PF) 100 MCG/2ML IJ SOLN
25.0000 ug | INTRAMUSCULAR | Status: DC | PRN
  Administered 2019-06-09 (×2): 50 ug via INTRAVENOUS

## 2019-06-09 MED ORDER — SODIUM CHLORIDE 0.9 % IR SOLN
Status: DC | PRN
  Administered 2019-06-09: 1000 mL

## 2019-06-09 MED ORDER — DIPHENHYDRAMINE HCL 12.5 MG/5ML PO ELIX: 12.5000 mg | ORAL_SOLUTION | Freq: Four times a day (QID) | ORAL | Status: DC | PRN

## 2019-06-09 MED ORDER — MORPHINE SULFATE (PF) 2 MG/ML IV SOLN: 1.0000 mg | INTRAVENOUS | Status: DC | PRN

## 2019-06-09 MED ORDER — BUPIVACAINE HCL (PF) 0.25 % IJ SOLN
INTRAMUSCULAR | Status: AC
  Filled 2019-06-09: qty 30

## 2019-06-09 MED ORDER — KETOROLAC TROMETHAMINE 15 MG/ML IJ SOLN: 15.0000 mg | Freq: Four times a day (QID) | INTRAMUSCULAR | Status: DC | PRN

## 2019-06-09 MED ORDER — LIDOCAINE 2% (20 MG/ML) 5 ML SYRINGE
INTRAMUSCULAR | Status: AC
  Filled 2019-06-09: qty 5

## 2019-06-09 MED ORDER — ACETAMINOPHEN 10 MG/ML IV SOLN: 1000.0000 mg | Freq: Once | INTRAVENOUS | Status: DC | PRN

## 2019-06-09 MED ORDER — DIPHENHYDRAMINE HCL 50 MG/ML IJ SOLN: 12.5000 mg | Freq: Four times a day (QID) | INTRAMUSCULAR | Status: DC | PRN

## 2019-06-09 MED ORDER — ENOXAPARIN SODIUM 40 MG/0.4ML ~~LOC~~ SOLN: 40.0000 mg | SUBCUTANEOUS | Status: DC

## 2019-06-09 MED ORDER — ONDANSETRON HCL 4 MG/2ML IJ SOLN: 4.0000 mg | Freq: Four times a day (QID) | INTRAMUSCULAR | Status: DC | PRN

## 2019-06-09 MED ORDER — FENTANYL CITRATE (PF) 250 MCG/5ML IJ SOLN
INTRAMUSCULAR | Status: AC
  Filled 2019-06-09: qty 5

## 2019-06-09 MED ORDER — ONDANSETRON HCL 4 MG/2ML IJ SOLN
INTRAMUSCULAR | Status: DC | PRN
  Administered 2019-06-09: 4 mg via INTRAVENOUS

## 2019-06-09 MED ORDER — ACETAMINOPHEN 500 MG PO TABS: 1000.0000 mg | ORAL_TABLET | Freq: Once | ORAL | Status: DC | PRN

## 2019-06-09 MED ORDER — KCL IN DEXTROSE-NACL 20-5-0.45 MEQ/L-%-% IV SOLN
INTRAVENOUS | Status: DC
  Filled 2019-06-09: qty 1000

## 2019-06-09 MED ORDER — SIMETHICONE 80 MG PO CHEW: 40.0000 mg | CHEWABLE_TABLET | Freq: Four times a day (QID) | ORAL | Status: DC | PRN

## 2019-06-09 MED ORDER — METRONIDAZOLE IN NACL 5-0.79 MG/ML-% IV SOLN
500.0000 mg | Freq: Once | INTRAVENOUS | Status: AC
  Administered 2019-06-09: 500 mg via INTRAVENOUS
  Filled 2019-06-09: qty 100

## 2019-06-09 MED ORDER — ROCURONIUM BROMIDE 10 MG/ML (PF) SYRINGE
PREFILLED_SYRINGE | INTRAVENOUS | Status: AC
  Filled 2019-06-09: qty 10

## 2019-06-09 MED ORDER — FENTANYL CITRATE (PF) 100 MCG/2ML IJ SOLN
INTRAMUSCULAR | Status: DC | PRN
  Administered 2019-06-09: 150 ug via INTRAVENOUS

## 2019-06-09 MED ORDER — PROPOFOL 10 MG/ML IV BOLUS
INTRAVENOUS | Status: AC
  Filled 2019-06-09: qty 20

## 2019-06-09 MED ORDER — ROCURONIUM BROMIDE 100 MG/10ML IV SOLN
INTRAVENOUS | Status: DC | PRN
  Administered 2019-06-09: 50 mg via INTRAVENOUS

## 2019-06-09 MED ORDER — ONDANSETRON 4 MG PO TBDP: 4.0000 mg | ORAL_TABLET | Freq: Four times a day (QID) | ORAL | Status: DC | PRN

## 2019-06-09 MED ORDER — ACETAMINOPHEN 500 MG PO TABS
1000.0000 mg | ORAL_TABLET | ORAL | Status: AC
  Administered 2019-06-09: 1000 mg via ORAL
  Filled 2019-06-09: qty 2

## 2019-06-09 SURGICAL SUPPLY — 53 items
APPLIER CLIP 5 13 M/L LIGAMAX5 (MISCELLANEOUS) ×2
APPLIER CLIP ROT 10 11.4 M/L (STAPLE)
BENZOIN TINCTURE PRP APPL 2/3 (GAUZE/BANDAGES/DRESSINGS) IMPLANT
BLADE CLIPPER SURG (BLADE) IMPLANT
BNDG ADH 1X3 SHEER STRL LF (GAUZE/BANDAGES/DRESSINGS) IMPLANT
CANISTER SUCT 3000ML PPV (MISCELLANEOUS) ×2 IMPLANT
CHLORAPREP W/TINT 26 (MISCELLANEOUS) ×2 IMPLANT
CLIP APPLIE 5 13 M/L LIGAMAX5 (MISCELLANEOUS) ×1 IMPLANT
CLIP APPLIE ROT 10 11.4 M/L (STAPLE) IMPLANT
COVER SURGICAL LIGHT HANDLE (MISCELLANEOUS) ×2 IMPLANT
COVER WAND RF STERILE (DRAPES) ×2 IMPLANT
CUTTER FLEX LINEAR 45M (STAPLE) ×2 IMPLANT
DERMABOND ADVANCED (GAUZE/BANDAGES/DRESSINGS) ×1
DERMABOND ADVANCED .7 DNX12 (GAUZE/BANDAGES/DRESSINGS) ×1 IMPLANT
DRSG TEGADERM 4X4.75 (GAUZE/BANDAGES/DRESSINGS) IMPLANT
ELECT REM PT RETURN 9FT ADLT (ELECTROSURGICAL) ×2
ELECTRODE REM PT RTRN 9FT ADLT (ELECTROSURGICAL) ×1 IMPLANT
ENDOLOOP SUT PDS II  0 18 (SUTURE)
ENDOLOOP SUT PDS II 0 18 (SUTURE) IMPLANT
GAUZE SPONGE 2X2 8PLY STRL LF (GAUZE/BANDAGES/DRESSINGS) IMPLANT
GLOVE BIOGEL M STRL SZ7.5 (GLOVE) ×2 IMPLANT
GLOVE INDICATOR 8.0 STRL GRN (GLOVE) ×4 IMPLANT
GOWN STRL REUS W/ TWL LRG LVL3 (GOWN DISPOSABLE) ×2 IMPLANT
GOWN STRL REUS W/TWL 2XL LVL3 (GOWN DISPOSABLE) ×2 IMPLANT
GOWN STRL REUS W/TWL LRG LVL3 (GOWN DISPOSABLE) ×2
GRASPER SUT TROCAR 14GX15 (MISCELLANEOUS) IMPLANT
KIT BASIN OR (CUSTOM PROCEDURE TRAY) ×2 IMPLANT
KIT TURNOVER KIT B (KITS) ×2 IMPLANT
NS IRRIG 1000ML POUR BTL (IV SOLUTION) ×2 IMPLANT
PAD ARMBOARD 7.5X6 YLW CONV (MISCELLANEOUS) ×4 IMPLANT
POUCH RETRIEVAL ECOSAC 10 (ENDOMECHANICALS) ×1 IMPLANT
POUCH RETRIEVAL ECOSAC 10MM (ENDOMECHANICALS) ×1
RELOAD 45 VASCULAR/THIN (ENDOMECHANICALS) IMPLANT
RELOAD STAPLE TA45 3.5 REG BLU (ENDOMECHANICALS) ×4 IMPLANT
SCISSORS LAP 5X35 DISP (ENDOMECHANICALS) IMPLANT
SET IRRIG TUBING LAPAROSCOPIC (IRRIGATION / IRRIGATOR) ×2 IMPLANT
SET TUBE SMOKE EVAC HIGH FLOW (TUBING) ×2 IMPLANT
SHEARS HARMONIC ACE PLUS 36CM (ENDOMECHANICALS) ×2 IMPLANT
SLEEVE ENDOPATH XCEL 5M (ENDOMECHANICALS) ×2 IMPLANT
SPECIMEN JAR SMALL (MISCELLANEOUS) ×2 IMPLANT
SPONGE GAUZE 2X2 STER 10/PKG (GAUZE/BANDAGES/DRESSINGS)
STRIP CLOSURE SKIN 1/2X4 (GAUZE/BANDAGES/DRESSINGS) IMPLANT
SUT MNCRL AB 4-0 PS2 18 (SUTURE) ×2 IMPLANT
SUT VICRYL 0 UR6 27IN ABS (SUTURE) IMPLANT
TOWEL GREEN STERILE (TOWEL DISPOSABLE) ×2 IMPLANT
TOWEL GREEN STERILE FF (TOWEL DISPOSABLE) ×2 IMPLANT
TRAY FOLEY W/BAG SLVR 16FR (SET/KITS/TRAYS/PACK) ×1
TRAY FOLEY W/BAG SLVR 16FR ST (SET/KITS/TRAYS/PACK) ×1 IMPLANT
TRAY LAPAROSCOPIC MC (CUSTOM PROCEDURE TRAY) ×2 IMPLANT
TROCAR BLADELESS 5MM (ENDOMECHANICALS) ×2 IMPLANT
TROCAR XCEL BLUNT TIP 100MML (ENDOMECHANICALS) ×2 IMPLANT
TROCAR XCEL NON-BLD 5MMX100MML (ENDOMECHANICALS) ×2 IMPLANT
WATER STERILE IRR 1000ML POUR (IV SOLUTION) ×2 IMPLANT

## 2019-06-09 NOTE — ED Provider Notes (Addendum)
  Face-to-face evaluation   History: She presents for evaluation of  appendicitis, based on outpatient CT imaging.  She has been ill for about 24 hours with generalized weakness, and mild anorexia.  No vomiting, weakness or dizziness.  Physical exam: Heart, calm cooperative.  No story distress.  No abdominal distention.  She appears stable for surgical intervention.  Medical screening examination/treatment/procedure(s) were conducted as a shared visit with non-physician practitioner(s) and myself.  I personally evaluated the patient during the encounter         Mancel Bale, MD 06/14/19 1032

## 2019-06-09 NOTE — Transfer of Care (Signed)
Immediate Anesthesia Transfer of Care Note  Patient: Kristine Graves  Procedure(s) Performed: APPENDECTOMY LAPAROSCOPIC (N/A )  Patient Location: PACU  Anesthesia Type:General  Level of Consciousness: awake, alert , oriented and patient cooperative  Airway & Oxygen Therapy: Patient Spontanous Breathing  Post-op Assessment: Report given to RN and Post -op Vital signs reviewed and stable  Post vital signs: Reviewed and stable  Last Vitals:  Vitals Value Taken Time  BP 123/63 06/09/19 2052  Temp    Pulse 73 06/09/19 2053  Resp 14 06/09/19 2053  SpO2 100 % 06/09/19 2053  Vitals shown include unvalidated device data.  Last Pain:  Vitals:   06/09/19 1605  TempSrc: Oral         Complications: No apparent anesthesia complications

## 2019-06-09 NOTE — Op Note (Signed)
Kristine Graves [id number] [birth year] 06/09/2019  Appendectomy, Lap, Procedure Note  Indications: The patient presented with a history of right-sided abdominal pain. A CT revealed findings consistent with acute appendicitis.  Pre-operative Diagnosis: Acute appendicitis  Post-operative Diagnosis: Same  Surgeon: Gaynelle Adu MD FACS  Assistants: none  Anesthesia: General endotracheal anesthesia  Procedure Details  The patient was seen again in the Holding Room.  She was given preoperative Tylenol and gabapentin.  The risks, benefits, complications, treatment options, and expected outcomes were discussed with the patient and/or family. The possibilities of perforation of viscus, bleeding, recurrent infection, the need for additional procedures, failure to diagnose a condition, and creating a complication requiring transfusion or operation were discussed. There was concurrence with the proposed plan and informed consent was obtained. The site of surgery was properly noted. The patient was taken to Operating Room, identified as [name] and the procedure verified as Appendectomy. A Time Out was held and the above information confirmed.  The patient was placed in the supine position and general anesthesia was induced, along with placement of orogastric tube, SCDs, and a Foley catheter. The abdomen was prepped and draped in a sterile fashion. A 1.5 centimeter infraumbilical incision was made.  The umbilical stalk was elevated, and the midline fascia was incised with a #11 blade.  A Kelly clamp was used to confirm entrance into the peritoneal cavity.  A pursestring suture was passed around the incision with a 0 Vicryl.  A 8mm Hasson was introduced into the abdomen and the tails of the suture were used to hold the Hasson in place.   The pneumoperitoneum was then established to steady pressure of 15 mmHg.  Additional 5 mm cannulas then placed in the left lower quadrant of the abdomen and  the suprapubic region under direct visualization. A careful evaluation of the entire abdomen was carried out. The patient was placed in Trendelenburg and left lateral decubitus position. The small intestines were retracted in the cephalad and left lateral direction away from the pelvis and right lower quadrant. The patient was found to have an inflamed appendix that was extending behind the cecum. There was no evidence of perforation.  The appendix was carefully dissected. The appendix was was skeletonized with the harmonic scalpel.   The base of the appendix and cecal cuff were thickened.  The midportion of the appendix was acutely inflamed.  The appendix was divided a little bit above its base incorporating a small portion of soft cecum using an endo-GIA stapler with blue load x2 . No appendiceal stump was left in place.  There was no narrowing of the junction of the terminal ileum and the cecum.  The appendix was removed from the abdomen with an Ecco bag through the umbilical port.  There was no evidence of leakage, or complication after division of the appendix. Irrigation was also performed and irrigate suctioned from the abdomen as well.  There was a little bit of oozing from the mesoappendix which was right next to the staple line anterior and posterior.  Hemostasis was achieved with 2 clips.  The staple line was reinspected there is no evidence of bleeding.  The staple line was intact.  A bilateral lateral abdominal wall tap block was performed using laparoscopic assistance.  The umbilical port site was closed with the purse string suture. The closure was viewed laparoscopically. There was no residual palpable fascial defect.  The trocar site skin wounds were closed with 4-0 Monocryl. Dermabond was applied to the  skin incisions.  Instrument, sponge, and needle counts were correct at the conclusion of the case.   Findings: The appendix was found to be inflamed. There were not signs of necrosis.   There was not perforation. There was not abscess formation.  Estimated Blood Loss:  Minimal         Drains: none         Specimens: appendix         Complications:  None; patient tolerated the procedure well.         Disposition: PACU - hemodynamically stable.         Condition: stable  Kristine Graves. Kristine Pulling, MD, FACS General, Bariatric, & Minimally Invasive Surgery [facility], [place]

## 2019-06-09 NOTE — ED Notes (Signed)
Dr Andrey Campanile at bedside to explain procedure risks/benefits and obtain informed consent in presence of this RN. Informed consent signed, witnessed, and is at bedside for OR staff.

## 2019-06-09 NOTE — H&P (Signed)
Kristine Graves is an 34 y.o. female.   Chief Complaint: abdominal pain HPI: 34 year old female was sent to the emergency room after an outpatient CT scan showed acute appendicitis.  She states that she developed right lower quadrant abdominal pain last evening.  She had not had any prior symptoms.  She went to sleep and woke up and it was slightly better but still there so she went to her PCP who ordered a CT scan.  She denies any fever, chills, nausea or vomiting.  She denies any diarrhea or constipation.  She took ibuprofen last night which did not make any significant improvement.  The pain does not radiate.  She denies any prior abdominal surgery.  She has not really had anything to eat today.  She has not really found anything that worsens her pain.  Past Medical History:  Diagnosis Date  . Blood transfusion without reported diagnosis    at birth RH incompatibility    Past Surgical History:  Procedure Laterality Date  . BREAST ENHANCEMENT SURGERY    . NASAL SINUS SURGERY      Family History  Problem Relation Age of Onset  . Hypertension Father   . Graves' disease Maternal Grandmother   . Diabetes Maternal Grandfather   . Heart attack Paternal Grandfather   . Diabetes Paternal Grandfather    Social History:  reports that she has never smoked. She has never used smokeless tobacco. She reports previous alcohol use. She reports that she does not use drugs.  Allergies: No Known Allergies  (Not in a hospital admission)   Results for orders placed or performed during the hospital encounter of 06/09/19 (from the past 48 hour(s))  CBC     Status: None   Collection Time: 06/09/19  5:00 PM  Result Value Ref Range   WBC 5.9 4.0 - 10.5 K/uL   RBC 4.24 3.87 - 5.11 MIL/uL   Hemoglobin 13.3 12.0 - 15.0 g/dL   HCT 01.6 01.0 - 93.2 %   MCV 96.7 80.0 - 100.0 fL   MCH 31.4 26.0 - 34.0 pg   MCHC 32.4 30.0 - 36.0 g/dL   RDW 35.5 73.2 - 20.2 %   Platelets 157 150 - 400 K/uL   nRBC 0.0  0.0 - 0.2 %    Comment: Performed at [facility], [address], [place], [place] [address]  Basic metabolic panel     Status: None   Collection Time: 06/09/19  5:00 PM  Result Value Ref Range   Sodium 136 135 - 145 mmol/L   Potassium 3.7 3.5 - 5.1 mmol/L   Chloride 105 98 - 111 mmol/L   CO2 22 22 - 32 mmol/L   Glucose, Bld 88 70 - 99 mg/dL   BUN 9 6 - 20 mg/dL   Creatinine, Ser 6.23 0.44 - 1.00 mg/dL   Calcium 9.2 8.9 - 76.2 mg/dL   GFR calc non Af Amer >60 >60 mL/min   GFR calc Af Amer >60 >60 mL/min   Anion gap 9 5 - 15    Comment: Performed at [facility], [address], [place], [place] [address]  I-Stat Beta hCG blood, ED ([facility], [facility], [facility] only)     Status: Abnormal   Collection Time: 06/09/19  5:15 PM  Result Value Ref Range   I-stat hCG, quantitative 5.3 (H) <5 mIU/mL   Comment 3            Comment:   GEST. AGE  CONC.  (mIU/mL)   <=1 WEEK        5 - 50     2 WEEKS       50 - 500     3 WEEKS       100 - 10,000     4 WEEKS     1,000 - 30,000        FEMALE AND NON-PREGNANT FEMALE:     LESS THAN 5 mIU/mL   Respiratory Panel by RT PCR (Flu A&B, Covid) - Nasopharyngeal Swab     Status: None   Collection Time: 06/09/19  5:33 PM   Specimen: Nasopharyngeal Swab  Result Value Ref Range   SARS Coronavirus 2 by RT PCR NEGATIVE NEGATIVE    Comment: (NOTE) SARS-CoV-2 target nucleic acids are NOT DETECTED. The SARS-CoV-2 RNA is generally detectable in upper respiratoy specimens during the acute phase of infection. The lowest concentration of SARS-CoV-2 viral copies this assay can detect is 131 copies/mL. A negative result does not preclude SARS-Cov-2 infection and should not be used as the sole basis for treatment or other patient management decisions. A negative result may occur with  improper specimen collection/handling, submission of specimen other than nasopharyngeal swab, presence of viral mutation(s) within the areas targeted by this assay, and inadequate  number of viral copies (<131 copies/mL). A negative result must be combined with clinical observations, patient history, and epidemiological information. The expected result is Negative. Fact Sheet for Patients:  https://www.moore.com/ Fact Sheet for Healthcare Providers:  https://www.young.biz/ This test is not yet ap proved or cleared by the [place] FDA and  has been authorized for detection and/or diagnosis of SARS-CoV-2 by FDA under an Emergency Use Authorization (EUA). This EUA will remain  in effect (meaning this test can be used) for the duration of the COVID-19 declaration under Section 564(b)(1) of the Act, 21 U.S.C. section 360bbb-3(b)(1), unless the authorization is terminated or revoked sooner.    Influenza A by PCR NEGATIVE NEGATIVE   Influenza B by PCR NEGATIVE NEGATIVE    Comment: (NOTE) The Xpert Xpress SARS-CoV-2/FLU/RSV assay is intended as an aid in  the diagnosis of influenza from Nasopharyngeal swab specimens and  should not be used as a sole basis for treatment. Nasal washings and  aspirates are unacceptable for Xpert Xpress SARS-CoV-2/FLU/RSV  testing. Fact Sheet for Patients: https://www.moore.com/ Fact Sheet for Healthcare Providers: https://www.young.biz/ This test is not yet approved or cleared by the [place] FDA and  has been authorized for detection and/or diagnosis of SARS-CoV-2 by  FDA under an Emergency Use Authorization (EUA). This EUA will remain  in effect (meaning this test can be used) for the duration of the  Covid-19 declaration under Section 564(b)(1) of the Act, 21  U.S.C. section 360bbb-3(b)(1), unless the authorization is  terminated or revoked. Performed at [facility], [address], [place], [place] [address]    CT ABDOMEN PELVIS W CONTRAST  Result Date: 06/09/2019 CLINICAL DATA:  Right lower quadrant abdominal pain, blood in stool  EXAM: CT ABDOMEN AND PELVIS WITH CONTRAST TECHNIQUE: Multidetector CT imaging of the abdomen and pelvis was performed using the standard protocol following bolus administration of intravenous contrast. CONTRAST:  ISOVUE-300 IOPAMIDOL (ISOVUE-300) INJECTION 61% COMPARISON:  None. FINDINGS: Lower chest: No acute abnormality. Hepatobiliary: There are multiple rounded low-density lesions throughout the liver, largest within the left hepatic lobe measuring 10 mm (series 2, image 13) which appears to have some peripheral nodular enhancement, incompletely evaluated. Remaining lesions  are too small to characterize. Statistically these are favored to represent cysts and/or hemangiomas. Gallbladder unremarkable. No biliary dilatation. Pancreas: Unremarkable. No pancreatic ductal dilatation or surrounding inflammatory changes. Spleen: Normal in size without focal abnormality. Adrenals/Urinary Tract: Adrenal glands are unremarkable. Kidneys are normal, without renal calculi, focal lesion, or hydronephrosis. Bladder is unremarkable for the degree of distension. Stomach/Bowel: Dilate hyperemic appendix with mild periappendiceal fat stranding (series 4, images 35-36; series 2, images 37-49). No appreciable periappendiceal free fluid or evidence of abscess formation. No pneumoperitoneum. Stomach and small bowel are unremarkable. No evidence of bowel obstruction. Mild volume of stool throughout the colon. Vascular/Lymphatic: Thoracic aorta is nonaneurysmal. Dilated tortuous ovarian veins bilaterally, left greater than right. No lymphadenopathy. Reproductive: Retroverted uterus with IUD. No adnexal masses. Other: Trace free fluid within the posterior cul-de-sac. Musculoskeletal: No acute or significant osseous findings. IMPRESSION: 1. Acute uncomplicated appendicitis. No periappendiceal free fluid or evidence of abscess formation. 2. Dilated tortuous ovarian veins bilaterally, left greater than right, which can be seen with  pelvic congestion syndrome. 3. Trace free fluid within the cul-de-sac, which may be physiologic. 4. Multiple rounded low-density lesions throughout the liver, largest measuring 10 mm. Statistically these are favored to represent cysts and/or hemangiomas. Remaining lesions are too small to characterize. No dedicated follow-up imaging is required. This recommendation follows ACR consensus guidelines: Management of Incidental Liver Lesions on CT: A White Paper of the ACR Incidental Findings Committee. J Am Coll Radiol 2017; 42:7062-3762. These results will be called to the ordering clinician or representative by the Radiologist Assistant, and communication documented in the PACS or zVision Dashboard. Electronically Signed   By: Duanne Guess D.O.   On: 06/09/2019 15:12    Review of Systems  Constitutional: Positive for appetite change. Negative for activity change, chills, fatigue, fever and unexpected weight change.  HENT: Negative for nosebleeds and trouble swallowing.   Eyes: Negative for photophobia and visual disturbance.  Respiratory: Negative for chest tightness and shortness of breath.   Cardiovascular: Negative for chest pain and leg swelling.       Denies CP, SOB, orthopnea, PND, DOE  Gastrointestinal: Positive for abdominal pain.  Genitourinary: Negative for difficulty urinating and dysuria.  Musculoskeletal: Negative for arthralgias.  Skin: Negative for pallor and rash.  Neurological: Negative for dizziness, seizures, facial asymmetry and numbness.       Denies TIA and amaurosis fugax   Hematological: Negative for adenopathy. Does not bruise/bleed easily.  Psychiatric/Behavioral: Negative for agitation and behavioral problems.  Otherwise a 12 point review of systems was negative  Blood pressure 115/83, pulse (!) 58, temperature 97.7 F (36.5 C), temperature source Oral, resp. rate 14, SpO2 100 %, unknown if currently breastfeeding. Physical Exam  Vitals reviewed. Constitutional:  She is oriented to person, place, and time. She appears well-developed and well-nourished. No distress.  HENT:  Head: Normocephalic and atraumatic.  Right Ear: External ear normal.  Left Ear: External ear normal.  Mouth/Throat: Mucous membranes are normal. No lacerations.  Eyes: Conjunctivae and lids are normal. No scleral icterus. Pupils are equal.  Neck: Trachea normal. No tracheal deviation present. No thyroid mass present.  Cardiovascular: Normal rate, regular rhythm, normal heart sounds, intact distal pulses and normal pulses.  Pulses:      Posterior tibial pulses are 2+ on the right side and 2+ on the left side.  Respiratory: Effort normal and breath sounds normal. No accessory muscle usage or stridor. No respiratory distress. She has no wheezes.  GI: Soft. Normal appearance. There  is no hepatosplenomegaly. There is abdominal tenderness in the right lower quadrant. There is no rigidity and no rebound. No hernia.  Musculoskeletal:        General: No edema.     Right forearm: No swelling, deformity or tenderness.     Left forearm: No swelling, deformity or tenderness.     Cervical back: Normal range of motion and neck supple.     Right upper leg: No swelling, deformity or tenderness.     Left upper leg: No swelling, deformity or tenderness.     Right lower leg: No swelling, deformity or tenderness. No edema.     Left lower leg: No swelling, deformity or tenderness. No edema.     Comments: Tone - nml   Lymphadenopathy:    She has no cervical adenopathy.       Right: No inguinal adenopathy present.       Left: No inguinal adenopathy present.  Neurological: She is alert and oriented to person, place, and time. No cranial nerve deficit or sensory deficit. She exhibits normal muscle tone. GCS eye subscore is 4. GCS verbal subscore is 5. GCS motor subscore is 6.  Skin: Skin is warm and dry. No rash noted. She is not diaphoretic. No erythema. No pallor.  No induration  Psychiatric: She has  a normal mood and affect. Her behavior is normal. Judgment and thought content normal. Cognition and memory are normal.     Assessment/Plan Appendicitis  Admit for observation Plan laparocopic appendectomy once Covid results are back IV antibiotic  We discussed the etiology and management of acute appendicitis. We discussed operative and nonoperative management.  I recommended operative management along with IV antibiotics.  We discussed laparoscopic appendectomy. We discussed the risk and benefits of surgery including but not limited to bleeding, infection, injury to surrounding structures, need to convert to an open procedure, blood clot formation, post operative abscess or wound infection, staple line complications such as leak or bleeding, hernia formation, post operative ileus, need for additional procedures, anesthesia complications, and the typical postoperative course. I explained that the patient should expect a good improvement in their symptoms.  [name]. [name], MD, FACS General, Bariatric, & Minimally Invasive Surgery [facility], [place]  Greer Pickerel, MD 06/09/2019, 7:03 PM

## 2019-06-09 NOTE — ED Provider Notes (Signed)
[facility] EMERGENCY DEPARTMENT Provider Note   CSN: [id number] Arrival date & time: 06/09/19  1604     History No chief complaint on file.   Kristine Graves is a 34 y.o. female with no relevant PMH who presents to the ED with a 2-day history of intermittent RLQ abdominal pain.  Patient reports that initially felt like "cramping" however last night it became progressively worse and she considered going to the ED.  This morning, her pain symptoms have not improved and she went to her primary care provider who subsequently referred her for outpatient CT imaging.  Patient reports that she been having mild hematochezia for 1 to 2 days prior to onset of her pain symptoms.  She denies any recent illness, fevers or chills, hematuria, urinary symptoms, nausea or vomiting, or other symptoms.  She has not had anything to eat or drink since a small breakfast early this morning.  No history of prior abdominal surgeries.  HPI     Past Medical History:  Diagnosis Date  . Blood transfusion without reported diagnosis    at birth [facility] incompatibility    Patient Active Problem List   Diagnosis Date Noted  . Indication for care in labor or delivery 07/13/2018  . SVD (spontaneous vaginal delivery) 07/13/2018  . Abnormal maternal serum screening test 06/07/2015  . High risk pregnancy with low PAPPA 06/07/2015    Past Surgical History:  Procedure Laterality Date  . BREAST ENHANCEMENT SURGERY    . NASAL SINUS SURGERY       OB History    Gravida  2   Para  2   Term  2   Preterm      AB      Living  2     SAB      TAB      Ectopic      Multiple  0   Live Births  2           Family History  Problem Relation Age of Onset  . Hypertension Father   . Graves' disease Maternal Grandmother   . Diabetes Maternal Grandfather   . Heart attack Paternal Grandfather   . Diabetes Paternal Grandfather     Social History   Tobacco Use  . Smoking status: Never  Smoker  . Smokeless tobacco: Never Used  Substance Use Topics  . Alcohol use: Not Currently  . Drug use: Never    Home Medications Prior to Admission medications   Medication Sig Start Date End Date Taking? Authorizing Provider  Prenatal Vit-Fe Fumarate-FA (PRENATAL MULTIVITAMIN) TABS tablet Take 1 tablet by mouth daily at 12 noon.    [provider]    Allergies    Patient has no known allergies.  Review of Systems   Review of Systems  All other systems reviewed and are negative.   Physical Exam Updated Vital Signs BP 124/90 (BP Location: Right Arm)   Pulse 62   Temp 97.7 F (36.5 C) (Oral)   Resp 14   SpO2 100%   Physical Exam Vitals and nursing note reviewed. Exam conducted with a chaperone present.  Constitutional:      Appearance: Normal appearance.  HENT:     Head: Normocephalic and atraumatic.  Eyes:     General: No scleral icterus.    Conjunctiva/sclera: Conjunctivae normal.  Cardiovascular:     Rate and Rhythm: Normal rate and regular rhythm.     Pulses: Normal pulses.  Heart sounds: Normal heart sounds.  Pulmonary:     Effort: Pulmonary effort is normal. No respiratory distress.     Breath sounds: Normal breath sounds.  Abdominal:     Comments: Soft, nondistended.  Mildly TTP over McBurney's point.  No TTP elsewhere.  No overlying skin changes.  No guarding.  No masses appreciated.  Negative Rovsing sign.  Musculoskeletal:     Cervical back: Normal range of motion. No rigidity.  Skin:    General: Skin is dry.     Capillary Refill: Capillary refill takes less than 2 seconds.  Neurological:     Mental Status: She is alert and oriented to person, place, and time.     GCS: GCS eye subscore is 4. GCS verbal subscore is 5. GCS motor subscore is 6.  Psychiatric:        Mood and Affect: Mood normal.        Behavior: Behavior normal.        Thought Content: Thought content normal.      ED Results / Procedures / Treatments   Labs (all labs  ordered are listed, but only abnormal results are displayed) Labs Reviewed  I-STAT BETA HCG BLOOD, ED ([facility], [facility], [facility] ONLY) - Abnormal; Notable for the following components:      Result Value   I-stat hCG, quantitative 5.3 (*)    All other components within normal limits  RESPIRATORY PANEL BY RT PCR (FLU A&B, COVID)  CBC  BASIC METABOLIC PANEL    EKG None  Radiology CT ABDOMEN PELVIS W CONTRAST  Result Date: 06/09/2019 CLINICAL DATA:  Right lower quadrant abdominal pain, blood in stool EXAM: CT ABDOMEN AND PELVIS WITH CONTRAST TECHNIQUE: Multidetector CT imaging of the abdomen and pelvis was performed using the standard protocol following bolus administration of intravenous contrast. CONTRAST:  160mL ISOVUE-300 IOPAMIDOL (ISOVUE-300) INJECTION 61% COMPARISON:  None. FINDINGS: Lower chest: No acute abnormality. Hepatobiliary: There are multiple rounded low-density lesions throughout the liver, largest within the left hepatic lobe measuring 10 mm (series 2, image 13) which appears to have some peripheral nodular enhancement, incompletely evaluated. Remaining lesions are too small to characterize. Statistically these are favored to represent cysts and/or hemangiomas. Gallbladder unremarkable. No biliary dilatation. Pancreas: Unremarkable. No pancreatic ductal dilatation or surrounding inflammatory changes. Spleen: Normal in size without focal abnormality. Adrenals/Urinary Tract: Adrenal glands are unremarkable. Kidneys are normal, without renal calculi, focal lesion, or hydronephrosis. Bladder is unremarkable for the degree of distension. Stomach/Bowel: Dilate hyperemic appendix with mild periappendiceal fat stranding (series 4, images 35-36; series 2, images 37-49). No appreciable periappendiceal free fluid or evidence of abscess formation. No pneumoperitoneum. Stomach and small bowel are unremarkable. No evidence of bowel obstruction. Mild volume of stool throughout the colon. Vascular/Lymphatic:  Thoracic aorta is nonaneurysmal. Dilated tortuous ovarian veins bilaterally, left greater than right. No lymphadenopathy. Reproductive: Retroverted uterus with IUD. No adnexal masses. Other: Trace free fluid within the posterior cul-de-sac. Musculoskeletal: No acute or significant osseous findings. IMPRESSION: 1. Acute uncomplicated appendicitis. No periappendiceal free fluid or evidence of abscess formation. 2. Dilated tortuous ovarian veins bilaterally, left greater than right, which can be seen with pelvic congestion syndrome. 3. Trace free fluid within the cul-de-sac, which may be physiologic. 4. Multiple rounded low-density lesions throughout the liver, largest measuring 10 mm. Statistically these are favored to represent cysts and/or hemangiomas. Remaining lesions are too small to characterize. No dedicated follow-up imaging is required. This recommendation follows ACR consensus guidelines: Management of Incidental Liver Lesions on CT:  A White Paper of the ACR Incidental Findings Committee. J Am Coll Radiol 2017; 63:0160-1093. These results will be called to the ordering clinician or representative by the Radiologist Assistant, and communication documented in the PACS or zVision Dashboard. Electronically Signed   By: Duanne Guess D.O.   On: 06/09/2019 15:12    Procedures Procedures (including critical care time)  Medications Ordered in ED Medications  cefTRIAXone (ROCEPHIN) 2 g in sodium chloride 0.9 % 100 mL IVPB (0 g Intravenous Stopped 06/09/19 1741)    And  metroNIDAZOLE (FLAGYL) IVPB 500 mg (500 mg Intravenous New Bag/Given 06/09/19 1737)  sodium chloride 0.9 % bolus 1,000 mL (1,000 mLs Intravenous New Bag/Given 06/09/19 1708)    ED Course  I have reviewed the triage vital signs and the nursing notes.  Pertinent labs & imaging results that were available during my care of the patient were reviewed by me and considered in my medical decision making (see chart for details).  Clinical  Course as of Jun 09 1803  Thu Jun 09, 2019  1743 Spoke with Dr. Andrey Campanile, general surgery, who will evaluate patient for her appendicitis.   [GG]    Clinical Course User Index [GG] Lorelee New, PA-C   MDM Rules/Calculators/A&P                      CT abdomen and pelvis with contrast obtained today demonstrates an acute uncomplicated appendicitis.  No evidence of abscess formation.  Basic labs will be obtained here in the ED and patient will be provided 1 L normal saline bolus as well as IV Rocephin and IV Flagyl.  Patient is declining any analgesic or antiemetic medication at this time.  We will obtain COVID-19 testing.  Will consult general surgery.  Spoke with Dr. Gaynelle Adu at [phone] and he will evaluate patient.   Final Clinical Impression(s) / ED Diagnoses Final diagnoses:  Acute appendicitis, unspecified acute appendicitis type    Rx / DC Orders ED Discharge Orders    None       Lorelee New, PA-C 06/13/19 0801    Mancel Bale, MD 06/14/19 1032

## 2019-06-09 NOTE — ED Notes (Signed)
No food since 8am , last liquids at 11am

## 2019-06-09 NOTE — Anesthesia Preprocedure Evaluation (Signed)
Anesthesia Evaluation  Patient identified by MRN, date of birth, ID band Patient awake    Reviewed: Allergy & Precautions, NPO status , Patient's Chart, lab work & pertinent test results  History of Anesthesia Complications Negative for: history of anesthetic complications  Airway Mallampati: I  TM Distance: >3 FB Neck ROM: Full    Dental  (+) Dental Advisory Given, Teeth Intact   Pulmonary neg recent URI,    breath sounds clear to auscultation       Cardiovascular negative cardio ROS   Rhythm:Regular     Neuro/Psych negative neurological ROS  negative psych ROS   GI/Hepatic Neg liver ROS, appendicitis   Endo/Other  negative endocrine ROS  Renal/GU negative Renal ROS     Musculoskeletal   Abdominal   Peds  Hematology negative hematology ROS (+)   Anesthesia Other Findings   Reproductive/Obstetrics Birth control                             Anesthesia Physical Anesthesia Plan  ASA: I  Anesthesia Plan: General   Post-op Pain Management:    Induction: Intravenous  PONV Risk Score and Plan: 3 and Ondansetron and Dexamethasone  Airway Management Planned: Oral ETT  Additional Equipment: None  Intra-op Plan:   Post-operative Plan: Extubation in OR  Informed Consent: I have reviewed the patients History and Physical, chart, labs and discussed the procedure including the risks, benefits and alternatives for the proposed anesthesia with the patient or authorized representative who has indicated his/her understanding and acceptance.     Dental advisory given  Plan Discussed with: CRNA and Surgeon  Anesthesia Plan Comments:         Anesthesia Quick Evaluation

## 2019-06-09 NOTE — Anesthesia Procedure Notes (Signed)
Procedure Name: Intubation Date/Time: 06/09/2019 7:41 PM Performed by: Oletta Lamas, CRNA Pre-anesthesia Checklist: Patient identified, Emergency Drugs available, Suction available and Patient being monitored Patient Re-evaluated:Patient Re-evaluated prior to induction Oxygen Delivery Method: Circle System Utilized Preoxygenation: Pre-oxygenation with 100% oxygen Induction Type: IV induction Ventilation: Mask ventilation without difficulty Laryngoscope Size: Mac and 3 Grade View: Grade I Tube type: Oral Number of attempts: 1 Airway Equipment and Method: Stylet Placement Confirmation: ETT inserted through vocal cords under direct vision,  positive ETCO2 and breath sounds checked- equal and bilateral Secured at: 22 cm Tube secured with: Tape Dental Injury: Teeth and Oropharynx as per pre-operative assessment

## 2019-06-09 NOTE — Discharge Instructions (Signed)
[facility] LAPAROSCOPIC SURGERY: POST OP INSTRUCTIONS Always review your discharge instruction sheet given to you by the facility where your surgery was performed. IF YOU HAVE DISABILITY OR FAMILY LEAVE FORMS, YOU MUST BRING THEM TO THE OFFICE FOR PROCESSING.   DO NOT GIVE THEM TO YOUR DOCTOR.  PAIN CONTROL  1. First take acetaminophen (Tylenol) AND/or ibuprofen (Advil) to control your pain after surgery.  Follow directions on package.  Taking acetaminophen (Tylenol) and/or ibuprofen (Advil) regularly after surgery will help to control your pain and lower the amount of prescription pain medication you may need.  You should not take more than 3,000 mg (3 grams) of acetaminophen (Tylenol) in 24 hours.  You should not take ibuprofen (Advil), aleve, motrin, naprosyn or other NSAIDS if you have a history of stomach ulcers or chronic kidney disease.  2. A prescription for pain medication may be given to you upon discharge.  Take your pain medication as prescribed, if you still have uncontrolled pain after taking acetaminophen (Tylenol) or ibuprofen (Advil). 3. Use ice packs to help control pain. 4. If you need a refill on your pain medication, please contact your pharmacy.  They will contact our office to request authorization. Prescriptions will not be filled after 5pm or on week-ends.  HOME MEDICATIONS 5. Take your usually prescribed medications unless otherwise directed.  DIET 6. You should follow a light diet the first few days after arrival home.  Be sure to include lots of fluids daily. Avoid fatty, fried foods.   CONSTIPATION 7. It is common to experience some constipation after surgery and if you are taking pain medication.  Increasing fluid intake and taking a stool softener (such as Colace) will usually help or prevent this problem from occurring.  A mild laxative (Milk of Magnesia or Miralax) should be taken according to package instructions if there are no bowel  movements after 48 hours.  WOUND/INCISION CARE 8. Most patients will experience some swelling and bruising in the area of the incisions.  Ice packs will help.  Swelling and bruising can take several days to resolve.  9. Unless discharge instructions indicate otherwise, follow guidelines below  a. STERI-STRIPS - you may remove your outer bandages 48 hours after surgery, and you may shower at that time.  You have steri-strips (small skin tapes) in place directly over the incision.  These strips should be left on the skin for 7-10 days.   b. DERMABOND/SKIN GLUE - you may shower in 24 hours.  The glue will flake off over the next 2-3 weeks. 10. Any sutures or staples will be removed at the office during your follow-up visit.  ACTIVITIES 11. You may resume regular (light) daily activities beginning the next day--such as daily self-care, walking, climbing stairs--gradually increasing activities as tolerated.  You may have sexual intercourse when it is comfortable.  Refrain from any heavy lifting or straining until approved by your doctor. a. You may drive when you are no longer taking prescription pain medication, you can comfortably wear a seatbelt, and you can safely maneuver your car and apply brakes.  FOLLOW-UP 12. You should see your doctor in the office for a follow-up appointment approximately 2-3 weeks after your surgery.  You should have been given your post-op/follow-up appointment when your surgery was scheduled.  If you did not receive a post-op/follow-up appointment, make sure that you call for this appointment within a day or two after you arrive home to insure a convenient appointment time.  OTHER   INSTRUCTIONS 13.   WHEN TO CALL YOUR DOCTOR: 1. Fever over 101.0 2. Inability to urinate 3. Continued bleeding from incision. 4. Increased pain, redness, or drainage from the incision. 5. Increasing abdominal pain  The clinic staff is available to answer your questions during regular  business hours.  Please don't hesitate to call and ask to speak to one of the nurses for clinical concerns.  If you have a medical emergency, go to the nearest emergency room or call 911.  A surgeon from [facility] is always on call at the hospital. [address], [address], [place], [place]  [address] ? [address] [address], [place],    [address] [phone] ? [phone] ? FAX [phone] Web site: [email or web address]  .........   Managing Your Pain After Surgery Without Opioids    Thank you for participating in our program to help patients manage their pain after surgery without opioids. This is part of our effort to provide you with the best care possible, without exposing you or your family to the risk that opioids pose.  What pain can I expect after surgery? You can expect to have some pain after surgery. This is normal. The pain is typically worse the day after surgery, and quickly begins to get better. Many studies have found that many patients are able to manage their pain after surgery with Over-the-Counter (OTC) medications such as Tylenol and Motrin. If you have a condition that does not allow you to take Tylenol or Motrin, notify your surgical team.  How will I manage my pain? The best strategy for controlling your pain after surgery is around the clock pain control with Tylenol (acetaminophen) and Motrin (ibuprofen or Advil). Alternating these medications with each other allows you to maximize your pain control. In addition to Tylenol and Motrin, you can use heating pads or ice packs on your incisions to help reduce your pain.  How will I alternate your regular strength over-the-counter pain medication? You will take a dose of pain medication every three hours. ; Start by taking 650 mg of Tylenol (2 pills of 325 mg) ; 3 hours later take 600 mg of Motrin (3 pills of 200 mg) ; 3 hours after taking the Motrin take 650 mg of Tylenol ; 3 hours  after that take 600 mg of Motrin.   - 1 -  See example - if your first dose of Tylenol is at 12:00 PM   12:00 PM Tylenol 650 mg (2 pills of 325 mg)  3:00 PM Motrin 600 mg (3 pills of 200 mg)  6:00 PM Tylenol 650 mg (2 pills of 325 mg)  9:00 PM Motrin 600 mg (3 pills of 200 mg)  Continue alternating every 3 hours   We recommend that you follow this schedule around-the-clock for at least 3 days after surgery, or until you feel that it is no longer needed. Use the table on the last page of this handout to keep track of the medications you are taking. Important: Do not take more than 3000mg of Tylenol or 1800mg of Motrin in a 24-hour period. Do not take ibuprofen/Motrin if you have a history of bleeding stomach ulcers, severe kidney disease, &/or actively taking a blood thinner  What if I still have pain? If you have pain that is not controlled with the over-the-counter pain medications (Tylenol and Motrin or Advil) you might have what we call "breakthrough" pain. You will receive a prescription for a small amount of an opioid   pain medication such as Oxycodone, Tramadol, or Tylenol with Codeine. Use these opioid pills in the first 24 hours after surgery if you have breakthrough pain. Do not take more than 1 pill every 4-6 hours.  If you still have uncontrolled pain after using all opioid pills, don't hesitate to call our staff using the number provided. We will help make sure you are managing your pain in the best way possible, and if necessary, we can provide a prescription for additional pain medication.   Day 1    Time  Name of Medication Number of pills taken  Amount of Acetaminophen  Pain Level   Comments  AM PM       AM PM       AM PM       AM PM       AM PM       AM PM       AM PM       AM PM       Total Daily amount of Acetaminophen Do not take more than  3,000 mg per day      Day 2    Time  Name of Medication Number of pills taken  Amount of Acetaminophen  Pain  Level   Comments  AM PM       AM PM       AM PM       AM PM       AM PM       AM PM       AM PM       AM PM       Total Daily amount of Acetaminophen Do not take more than  3,000 mg per day      Day 3    Time  Name of Medication Number of pills taken  Amount of Acetaminophen  Pain Level   Comments  AM PM       AM PM       AM PM       AM PM          AM PM       AM PM       AM PM       AM PM       Total Daily amount of Acetaminophen Do not take more than  3,000 mg per day      Day 4    Time  Name of Medication Number of pills taken  Amount of Acetaminophen  Pain Level   Comments  AM PM       AM PM       AM PM       AM PM       AM PM       AM PM       AM PM       AM PM       Total Daily amount of Acetaminophen Do not take more than  3,000 mg per day      Day 5    Time  Name of Medication Number of pills taken  Amount of Acetaminophen  Pain Level   Comments  AM PM       AM PM       AM PM       AM PM       AM PM       AM PM         AM PM       AM PM       Total Daily amount of Acetaminophen Do not take more than  3,000 mg per day       Day 6    Time  Name of Medication Number of pills taken  Amount of Acetaminophen  Pain Level  Comments  AM PM       AM PM       AM PM       AM PM       AM PM       AM PM       AM PM       AM PM       Total Daily amount of Acetaminophen Do not take more than  3,000 mg per day      Day 7    Time  Name of Medication Number of pills taken  Amount of Acetaminophen  Pain Level   Comments  AM PM       AM PM       AM PM       AM PM       AM PM       AM PM       AM PM       AM PM       Total Daily amount of Acetaminophen Do not take more than  3,000 mg per day        For additional information about how and where to safely dispose of unused opioid medications - https://www.morepowerfulnc.org  Disclaimer: This document contains information and/or instructional materials adapted  from [facility] for the typical patient with your condition. It does not replace medical advice from your health care provider because your experience may differ from that of the typical patient. Talk to your health care provider if you have any questions about this document, your condition or your treatment plan. Adapted from [facility]   

## 2019-06-09 NOTE — Progress Notes (Signed)
Kristine Graves is a 34 y.o. female patient admitted from ED awake, alert - oriented  X 4 - no acute distress noted.  VSS - Blood pressure 118/80, pulse 77, temperature 97.7 F (36.5 C), temperature source Oral, resp. rate 16, SpO2 99 %, unknown if currently breastfeeding.    IV in place, occlusive dsg intact without redness.   Will cont to eval and treat per MD orders.  Rolland Porter, RN 06/09/2019 10:18 PM

## 2019-06-09 NOTE — ED Triage Notes (Signed)
Pt here with appendicitis confirmed with ct out pt. Pain has been ongoing for a couple of days

## 2019-06-10 ENCOUNTER — Encounter: Payer: Self-pay | Admitting: *Deleted

## 2019-06-10 LAB — CBC
HCT: 35.4 % — ABNORMAL LOW (ref 36.0–46.0)
Hemoglobin: 11.5 g/dL — ABNORMAL LOW (ref 12.0–15.0)
MCH: 31.1 pg (ref 26.0–34.0)
MCHC: 32.5 g/dL (ref 30.0–36.0)
MCV: 95.7 fL (ref 80.0–100.0)
Platelets: 124 10*3/uL — ABNORMAL LOW (ref 150–400)
RBC: 3.7 MIL/uL — ABNORMAL LOW (ref 3.87–5.11)
RDW: 12.6 % (ref 11.5–15.5)
WBC: 8.4 10*3/uL (ref 4.0–10.5)
nRBC: 0 % (ref 0.0–0.2)

## 2019-06-10 MED ORDER — IBUPROFEN 200 MG PO TABS: ORAL_TABLET | ORAL | Status: AC

## 2019-06-10 MED ORDER — IBUPROFEN 600 MG PO TABS: 600.0000 mg | ORAL_TABLET | Freq: Four times a day (QID) | ORAL | Status: DC | PRN

## 2019-06-10 MED ORDER — ACETAMINOPHEN 500 MG PO TABS: ORAL_TABLET | ORAL | 0 refills | Status: AC

## 2019-06-10 MED ORDER — OXYCODONE HCL 5 MG PO TABS: 5.0000 mg | ORAL_TABLET | Freq: Four times a day (QID) | ORAL | 0 refills | Status: DC | PRN

## 2019-06-10 MED ORDER — OXYCODONE HCL 5 MG PO TABS: ORAL_TABLET | ORAL | 0 refills | Status: DC

## 2019-06-10 NOTE — Progress Notes (Signed)
Kristine Graves to be D/C'd  per MD order. Discussed with the patient and all questions fully answered.  VSS, Skin clean, dry and intact without evidence of skin break down, no evidence of skin tears noted.  IV catheter discontinued intact. Site without signs and symptoms of complications. Dressing and pressure applied.  An After Visit Summary was printed and given to the patient. Patient received prescription.  D/c education completed with patient/family including follow up instructions, medication list, d/c activities limitations if indicated, with other d/c instructions as indicated by MD - patient able to verbalize understanding, all questions fully answered.   Patient instructed to return to ED, call 911, or call MD for any changes in condition.   Patient to be escorted via WC, and D/C home via private auto.

## 2019-06-10 NOTE — Plan of Care (Signed)
  Problem: Education: Goal: Verbalization of understanding the information provided will improve Outcome: Completed/Met   Problem: Bowel/Gastric: Goal: Gastrointestinal status for postoperative course will improve Outcome: Completed/Met   Problem: Physical Regulation: Goal: Postoperative complications will be avoided or minimized Outcome: Completed/Met   Problem: Respiratory: Goal: Respiratory status will improve Outcome: Completed/Met   Problem: Skin Integrity: Goal: Demonstration of wound healing without infection will improve Outcome: Completed/Met

## 2019-06-10 NOTE — Discharge Summary (Addendum)
Physician Discharge Summary  Patient ID: Kristine Graves MRN: [id number] DOB/AGE: Sep 16, 1985 34 y.o.  Admit date: 06/09/2019 Discharge date: 06/10/2019  Admission Diagnoses:  Acute appendicitis  Discharge Diagnoses:  Acute appendicitis   Active Problems:   Acute appendicitis   PROCEDURES: Laparoscopic appendectomy 06/09/2019, Dr. Janalyn Rouse Course:  34 year old female was sent to the emergency room after an outpatient CT scan showed acute appendicitis.  She states that she developed right lower quadrant abdominal pain last evening.  She had not had any prior symptoms.  She went to sleep and woke up and it was slightly better but still there so she went to her PCP who ordered a CT scan.  She denies any fever, chills, nausea or vomiting.  She denies any diarrhea or constipation.  She took ibuprofen last night which did not make any significant improvement.  The pain does not radiate.  She denies any prior abdominal surgery.  She has not really had anything to eat today.  She has not really found anything that worsens her pain.  Work-up in the ED that she was afebrile vital signs are stable.  Labs showed her BMP was normal, WBC 5.9, H/H 13.3/41.0, platelets 157,000.  hCG 5.3, Covid was negative.  CT of the abdomen revealed a dilated hyperemic appendix with mild periappendiceal fat stranding.  No evidence of appreciable periappendiceal free fluid or evidence of abscess formation, no pneumoperitoneum.  Stomach and small bowels were unremarkable.  She also had dilated tortuous ovarian veins left greater than the right consistent with pelvic congestion syndrome.  She was seen in the ED by Dr. Greer Pickerel taken the operating room for laparoscopic appendectomy.  The appendix was found to be inflamed.  There were no signs of necrosis.  There was no perforation no abscess formation. She was doing well the first postoperative morning.  She was mobilized and discharged first postoperative  morning.  Prior to Admission medications   Medication Sig Start Date End Date Taking? Authorizing Provider  Prenatal Vit-Fe Fumarate-FA (PRENATAL MULTIVITAMIN) TABS tablet Take 1 tablet by mouth daily at 12 noon.    [provider]  A.m. labs are stable.  Physical exam: Patient is alert, she is up ambulating to the bathroom.,  Ready to eat breakfast.  No significant discomfort.  All port sites look good. Respiratory: Clear to auscultation Abdomen: Soft, sore, port sites all look fine.   CBC Latest Ref Rng & Units 06/10/2019 06/09/2019 07/14/2018  WBC 4.0 - 10.5 K/uL 8.4 5.9 8.8  Hemoglobin 12.0 - 15.0 g/dL 11.5(L) 13.3 12.4  Hematocrit 36.0 - 46.0 % 35.4(L) 41.0 38.1  Platelets 150 - 400 K/uL 124(L) 157 135(L)   CMP Latest Ref Rng & Units 06/09/2019  Glucose 70 - 99 mg/dL 88  BUN 6 - 20 mg/dL 9  Creatinine 0.44 - 1.00 mg/dL 0.67  Sodium 135 - 145 mmol/L 136  Potassium 3.5 - 5.1 mmol/L 3.7  Chloride 98 - 111 mmol/L 105  CO2 22 - 32 mmol/L 22  Calcium 8.9 - 10.3 mg/dL 9.2    condition on DC:  Improved   Disposition: Discharge disposition: 01-Home or Self Care        FEN: IV fluids/regular diet ID: Rocephin/Flagyl preop DVT: Lovenox Follow-up: [facility]   Allergies as of 06/10/2019   No Known Allergies     Medication List    TAKE these medications   acetaminophen 500 MG tablet Commonly known as: TYLENOL You can take 1000 mg of Tylenol/acetaminophen  every 8 hours as needed for pain.  For the next 24-48hrs we recommend you just take it on a schedule.  As pain improves you can go back to using it as needed.  You can buy this over-the-counter at any drugstore.  Do not exceed 4000 mg of Tylenol per day it can harm your liver.   escitalopram 10 MG tablet Commonly known as: LEXAPRO Take 10 mg by mouth daily.   ibuprofen 200 MG tablet Commonly known as: ADVIL You can take 2 to 3 tablets every 6 hours as needed for pain.  This is your second pain control  medication.  You can alternate this with plain Tylenol/acetaminophen.  You can buy this over-the-counter at any drugstore.   multivitamin with minerals tablet Take 1 tablet by mouth daily.   oxyCODONE 5 MG immediate release tablet Commonly known as: Oxy IR/ROXICODONE 1 tablet every 6 hours as needed for pain not relieved by Tylenol/ibuprofen      Follow-up Information    [facility] Follow up on 06/21/2019.   Specialty: General Surgery Why: Follow up appointment scheduled for11:15 AM. A provider will call you during scheduled appointment time. Please send a photo of incisions,license, and insurance card to [email or web address] the day prior to appointment.   Contact information: [address] [place] [address] [phone]        Mila Palmer, MD Follow up.   Specialty: Family Medicine Why: Call and let them know you had surgery. Contact information: [address] [place] [address] [phone]           Signed: Sherrie George 06/10/2019, 10:46 AM

## 2019-06-13 LAB — SURGICAL PATHOLOGY

## 2019-06-14 NOTE — Anesthesia Postprocedure Evaluation (Signed)
Anesthesia Post Note  Patient: Kristine Graves  Procedure(s) Performed: APPENDECTOMY LAPAROSCOPIC (N/A )     Patient location during evaluation: PACU Anesthesia Type: General Level of consciousness: awake and alert Pain management: pain level controlled Vital Signs Assessment: post-procedure vital signs reviewed and stable Respiratory status: spontaneous breathing, nonlabored ventilation, respiratory function stable and patient connected to nasal cannula oxygen Cardiovascular status: blood pressure returned to baseline and stable Postop Assessment: no apparent nausea or vomiting Anesthetic complications: no    Last Vitals:  Vitals:   06/10/19 0132 06/10/19 0503  BP: 111/70 101/66  Pulse: 69 66  Resp: 16 14  Temp: 36.9 C 36.8 C  SpO2: 97% 98%    Last Pain:  Vitals:   06/10/19 0811  TempSrc:   PainSc: 6                  Aria Pickrell

## 2019-07-05 ENCOUNTER — Ambulatory Visit: Payer: BLUE CROSS/BLUE SHIELD | Attending: Internal Medicine

## 2019-07-05 DIAGNOSIS — Z20822 Contact with and (suspected) exposure to covid-19: Secondary | ICD-10-CM | POA: Diagnosis not present

## 2019-07-06 LAB — NOVEL CORONAVIRUS, NAA: SARS-CoV-2, NAA: NOT DETECTED

## 2019-07-12 DIAGNOSIS — Z20828 Contact with and (suspected) exposure to other viral communicable diseases: Secondary | ICD-10-CM | POA: Diagnosis not present

## 2019-08-30 DIAGNOSIS — Z6820 Body mass index (BMI) 20.0-20.9, adult: Secondary | ICD-10-CM | POA: Diagnosis not present

## 2019-08-30 DIAGNOSIS — Z01419 Encounter for gynecological examination (general) (routine) without abnormal findings: Secondary | ICD-10-CM | POA: Diagnosis not present

## 2019-08-30 DIAGNOSIS — R61 Generalized hyperhidrosis: Secondary | ICD-10-CM | POA: Diagnosis not present

## 2019-11-30 DIAGNOSIS — B948 Sequelae of other specified infectious and parasitic diseases: Secondary | ICD-10-CM | POA: Diagnosis not present

## 2019-11-30 DIAGNOSIS — Z79899 Other long term (current) drug therapy: Secondary | ICD-10-CM | POA: Diagnosis not present

## 2019-11-30 DIAGNOSIS — Z13220 Encounter for screening for lipoid disorders: Secondary | ICD-10-CM | POA: Diagnosis not present

## 2019-11-30 DIAGNOSIS — Z0000 Encounter for general adult medical examination without abnormal findings: Secondary | ICD-10-CM | POA: Diagnosis not present

## 2019-12-27 ENCOUNTER — Other Ambulatory Visit: Payer: BLUE CROSS/BLUE SHIELD

## 2020-02-21 DIAGNOSIS — D2272 Melanocytic nevi of left lower limb, including hip: Secondary | ICD-10-CM | POA: Diagnosis not present

## 2020-02-21 DIAGNOSIS — L821 Other seborrheic keratosis: Secondary | ICD-10-CM | POA: Diagnosis not present

## 2020-02-21 DIAGNOSIS — L814 Other melanin hyperpigmentation: Secondary | ICD-10-CM | POA: Diagnosis not present

## 2020-02-21 DIAGNOSIS — D225 Melanocytic nevi of trunk: Secondary | ICD-10-CM | POA: Diagnosis not present

## 2020-02-21 DIAGNOSIS — Z23 Encounter for immunization: Secondary | ICD-10-CM | POA: Diagnosis not present

## 2020-04-14 IMAGING — CT CT ABD-PELV W/ CM
1 of 2 series · 13 of 32 positions shown, 18 images · IV contrast (APPLIED)
Comparison: None.

CLINICAL DATA: Right lower quadrant abdominal pain, blood in stool

EXAM:
CT ABDOMEN AND PELVIS WITH CONTRAST
TECHNIQUE: Multidetector CT imaging of the abdomen and pelvis was performed
using the standard protocol following bolus administration of
intravenous contrast.
CONTRAST:  100mL LFZQOC-1JJ IOPAMIDOL (LFZQOC-1JJ) INJECTION 61%

[Series 2: abd/pelvis w/cm · axial · 0.67mm/px · z∈[-420,-60]mm · 13 of 82 slices shown, 18 images]
[im 5/82  soft-tissue]
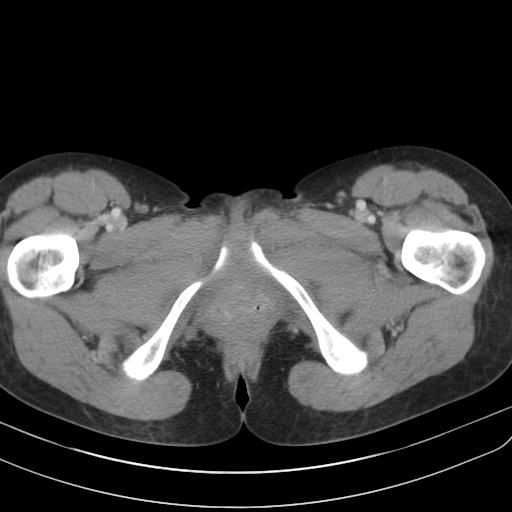
[im 5/82  bone]
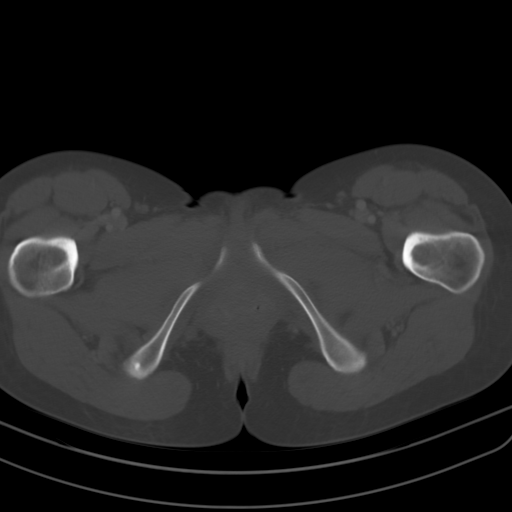
[im 14/82  soft-tissue]
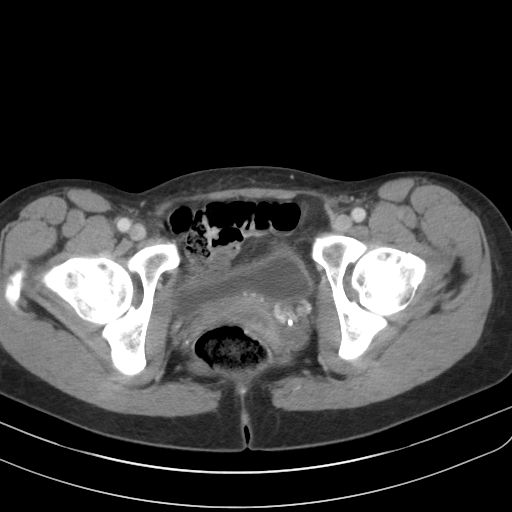
[im 19/82  soft-tissue]
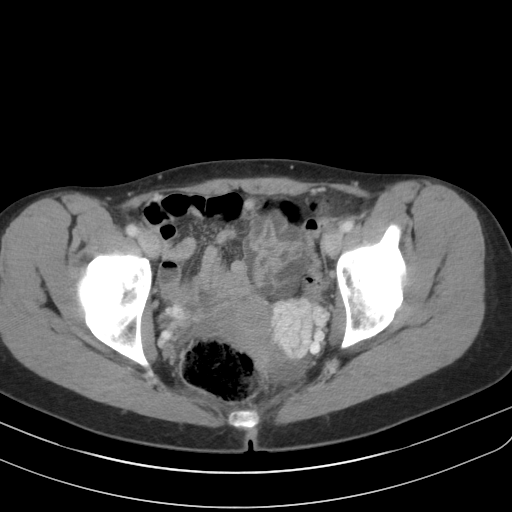
[im 23/82  soft-tissue]
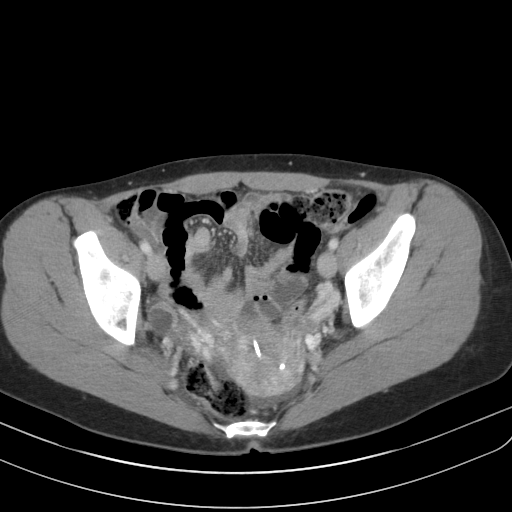
[im 32/82  soft-tissue]
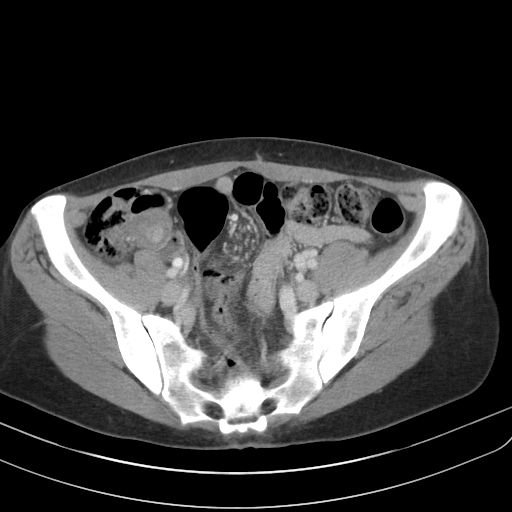
[im 37/82  soft-tissue]
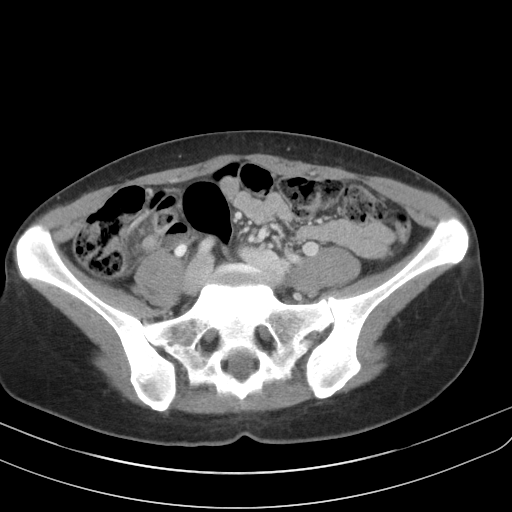
[im 46/82  soft-tissue]
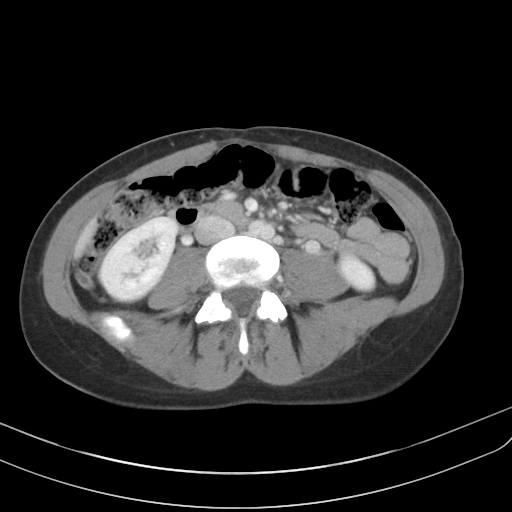
[im 50/82  soft-tissue]
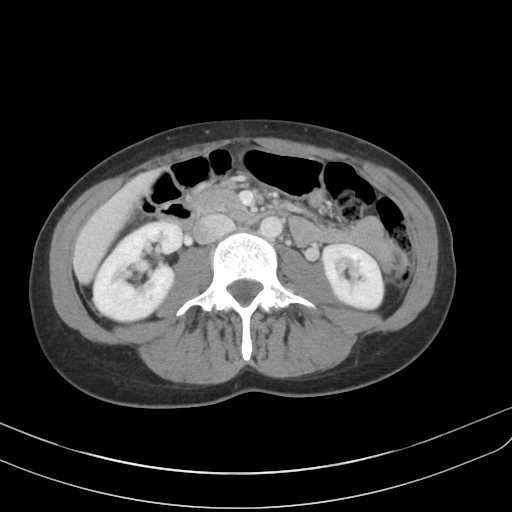
[im 59/82  soft-tissue]
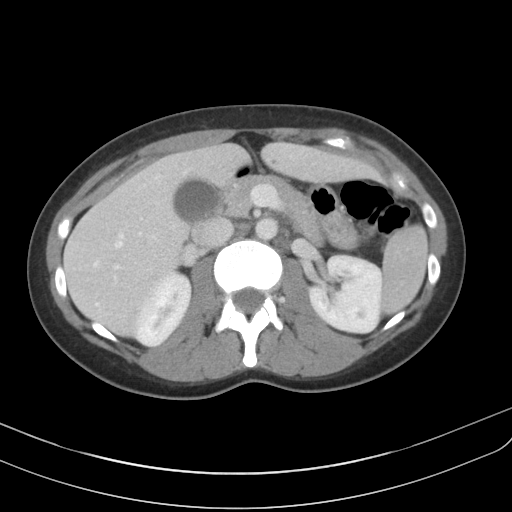
[im 59/82  bone]
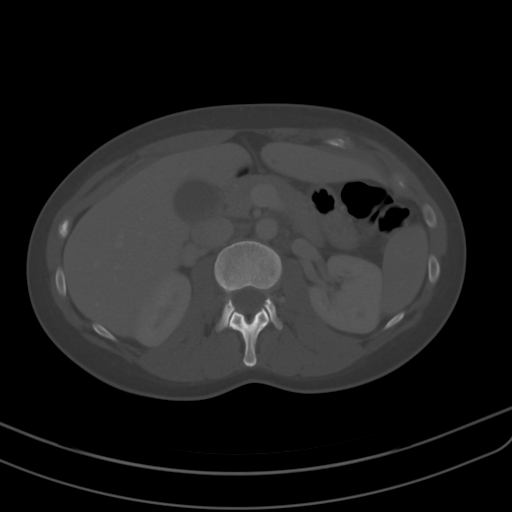
[im 64/82  soft-tissue]
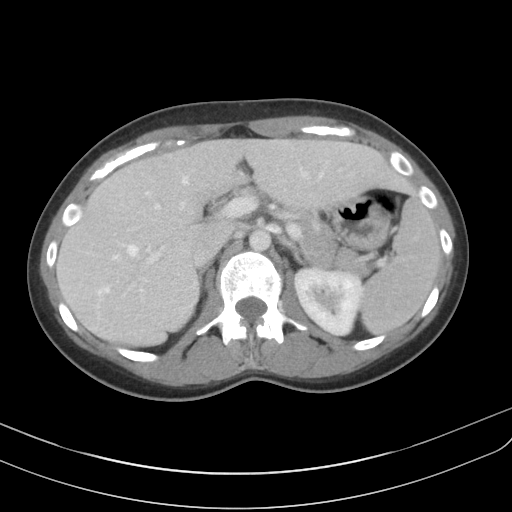
[im 64/82  lung]
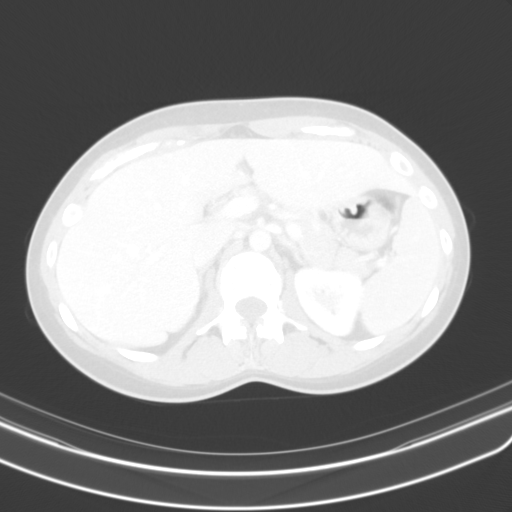
[im 68/82  soft-tissue]
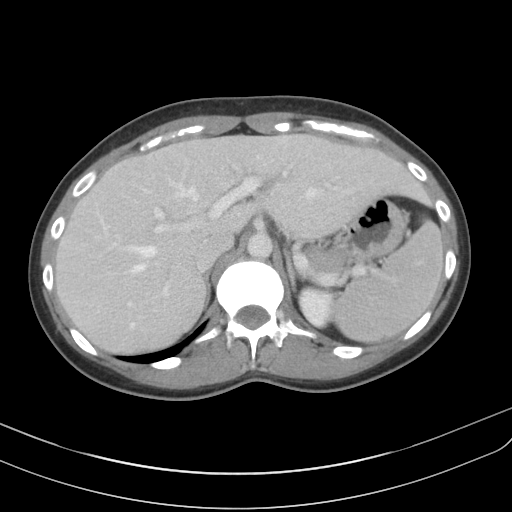
[im 68/82  lung]
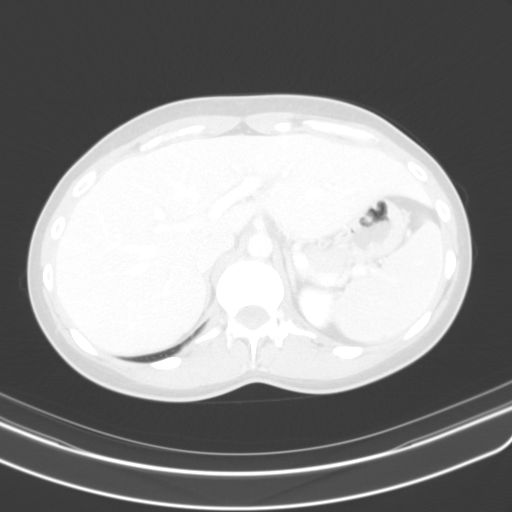
[im 73/82  lung]
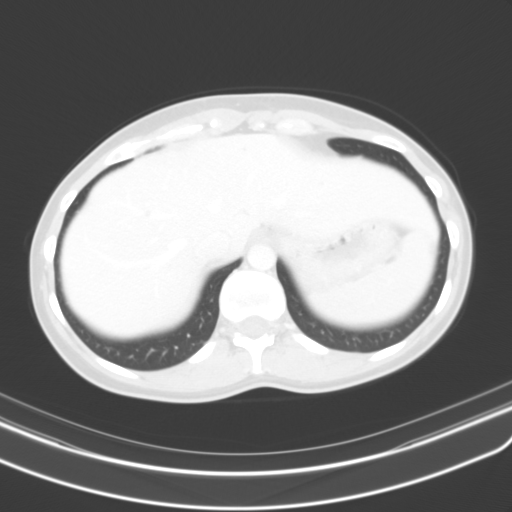
[im 77/82  soft-tissue]
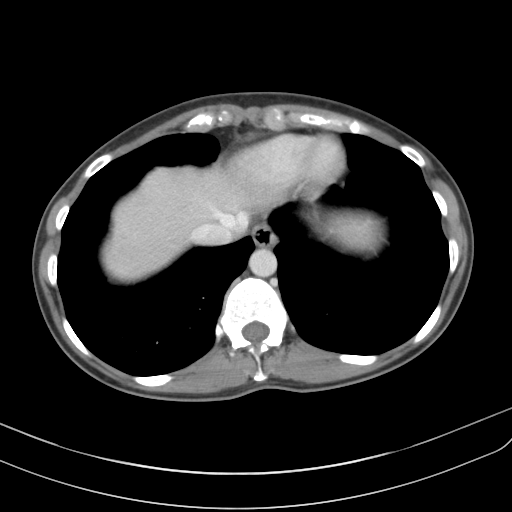
[im 77/82  lung]
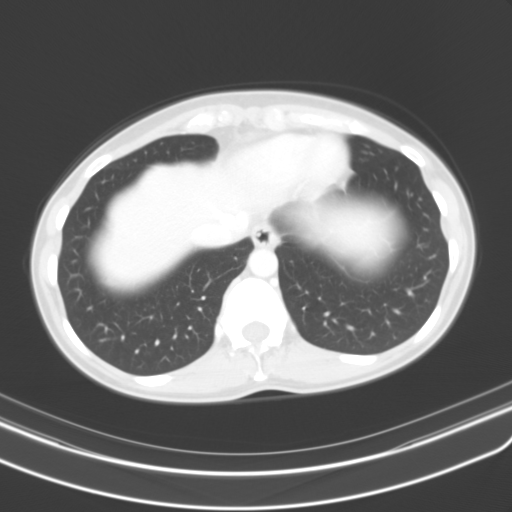

[13 of 32 positions shown; findings below may reference images not displayed]

FINDINGS: Lower chest: No acute abnormality.

Hepatobiliary: There are multiple rounded low-density lesions
throughout the liver, largest within the left hepatic lobe measuring
10 mm (series 2, image 13) which appears to have some peripheral
nodular enhancement, incompletely evaluated. Remaining lesions are
too small to characterize. Statistically these are favored to
represent cysts and/or hemangiomas. Gallbladder unremarkable. No
biliary dilatation.

Pancreas: Unremarkable. No pancreatic ductal dilatation or
surrounding inflammatory changes.

Spleen: Normal in size without focal abnormality.

Adrenals/Urinary Tract: Adrenal glands are unremarkable. Kidneys are
normal, without renal calculi, focal lesion, or hydronephrosis.
Bladder is unremarkable for the degree of distension.

Stomach/Bowel: Dilate hyperemic appendix with mild periappendiceal
fat stranding (series 4, images 35-36; series 2, images 37-49). No
appreciable periappendiceal free fluid or evidence of abscess
formation. No pneumoperitoneum. Stomach and small bowel are
unremarkable. No evidence of bowel obstruction. Mild volume of stool
throughout the colon.

Vascular/Lymphatic: Thoracic aorta is nonaneurysmal. Dilated
tortuous ovarian veins bilaterally, left greater than right. No
lymphadenopathy.

Reproductive: Retroverted uterus with IUD. No adnexal masses.

Other: Trace free fluid within the posterior cul-de-sac.

Musculoskeletal: No acute or significant osseous findings.
IMPRESSION: 1. Acute uncomplicated appendicitis. No periappendiceal free fluid
or evidence of abscess formation.
2. Dilated tortuous ovarian veins bilaterally, left greater than
right, which can be seen with pelvic congestion syndrome.
3. Trace free fluid within the cul-de-sac, which may be physiologic.
4. Multiple rounded low-density lesions throughout the liver,
largest measuring 10 mm. Statistically these are favored to
represent cysts and/or hemangiomas. Remaining lesions are too small
to characterize. No dedicated follow-up imaging is required. This
recommendation follows ACR consensus guidelines: Management of
Incidental Liver Lesions on CT: A White Paper of the ACR Incidental
Findings Committee. [HOSPITAL] 6896; 14:5552-5584.

These results will be called to the ordering clinician or
representative by the Radiologist Assistant, and communication
documented in the PACS or zVision Dashboard.

## 2020-04-17 DIAGNOSIS — Z20822 Contact with and (suspected) exposure to covid-19: Secondary | ICD-10-CM | POA: Diagnosis not present

## 2020-05-16 ENCOUNTER — Other Ambulatory Visit: Payer: BC Managed Care – PPO

## 2020-09-05 DIAGNOSIS — Z01419 Encounter for gynecological examination (general) (routine) without abnormal findings: Secondary | ICD-10-CM | POA: Diagnosis not present

## 2020-09-05 DIAGNOSIS — Z6821 Body mass index (BMI) 21.0-21.9, adult: Secondary | ICD-10-CM | POA: Diagnosis not present

## 2020-10-29 DIAGNOSIS — F411 Generalized anxiety disorder: Secondary | ICD-10-CM | POA: Diagnosis not present

## 2020-12-10 DIAGNOSIS — Z79899 Other long term (current) drug therapy: Secondary | ICD-10-CM | POA: Diagnosis not present

## 2020-12-10 DIAGNOSIS — Z0000 Encounter for general adult medical examination without abnormal findings: Secondary | ICD-10-CM | POA: Diagnosis not present

## 2021-01-23 DIAGNOSIS — Z862 Personal history of diseases of the blood and blood-forming organs and certain disorders involving the immune mechanism: Secondary | ICD-10-CM | POA: Diagnosis not present

## 2021-01-23 DIAGNOSIS — R748 Abnormal levels of other serum enzymes: Secondary | ICD-10-CM | POA: Diagnosis not present

## 2021-01-23 DIAGNOSIS — Z23 Encounter for immunization: Secondary | ICD-10-CM | POA: Diagnosis not present

## 2021-01-30 ENCOUNTER — Other Ambulatory Visit: Payer: Self-pay | Admitting: Family Medicine

## 2021-01-30 DIAGNOSIS — R748 Abnormal levels of other serum enzymes: Secondary | ICD-10-CM

## 2021-02-13 ENCOUNTER — Ambulatory Visit
Admission: RE | Admit: 2021-02-13 | Discharge: 2021-02-13 | Disposition: A | Discharge status: Home / Self Care | Payer: BC Managed Care – PPO | Source: Ambulatory Visit | Attending: Family Medicine | Admitting: Family Medicine

## 2021-02-13 DIAGNOSIS — R7989 Other specified abnormal findings of blood chemistry: Secondary | ICD-10-CM | POA: Diagnosis not present

## 2021-02-13 DIAGNOSIS — R748 Abnormal levels of other serum enzymes: Secondary | ICD-10-CM

## 2021-02-21 DIAGNOSIS — D2272 Melanocytic nevi of left lower limb, including hip: Secondary | ICD-10-CM | POA: Diagnosis not present

## 2021-02-21 DIAGNOSIS — L814 Other melanin hyperpigmentation: Secondary | ICD-10-CM | POA: Diagnosis not present

## 2021-02-21 DIAGNOSIS — D225 Melanocytic nevi of trunk: Secondary | ICD-10-CM | POA: Diagnosis not present

## 2021-02-21 DIAGNOSIS — L821 Other seborrheic keratosis: Secondary | ICD-10-CM | POA: Diagnosis not present

## 2021-03-26 DIAGNOSIS — R748 Abnormal levels of other serum enzymes: Secondary | ICD-10-CM | POA: Diagnosis not present

## 2021-07-02 DIAGNOSIS — L710 Perioral dermatitis: Secondary | ICD-10-CM | POA: Diagnosis not present

## 2021-09-10 DIAGNOSIS — Z6821 Body mass index (BMI) 21.0-21.9, adult: Secondary | ICD-10-CM | POA: Diagnosis not present

## 2021-09-10 DIAGNOSIS — Z01419 Encounter for gynecological examination (general) (routine) without abnormal findings: Secondary | ICD-10-CM | POA: Diagnosis not present

## 2021-09-23 DIAGNOSIS — M79675 Pain in left toe(s): Secondary | ICD-10-CM | POA: Diagnosis not present

## 2021-10-08 DIAGNOSIS — S92515A Nondisplaced fracture of proximal phalanx of left lesser toe(s), initial encounter for closed fracture: Secondary | ICD-10-CM | POA: Diagnosis not present

## 2021-11-15 DIAGNOSIS — S92515A Nondisplaced fracture of proximal phalanx of left lesser toe(s), initial encounter for closed fracture: Secondary | ICD-10-CM | POA: Diagnosis not present

## 2021-11-30 ENCOUNTER — Emergency Department (HOSPITAL_COMMUNITY): Payer: BC Managed Care – PPO

## 2021-11-30 ENCOUNTER — Observation Stay (HOSPITAL_COMMUNITY)
Admission: EM | Admit: 2021-11-30 | Discharge: 2021-12-01 | Disposition: A | Discharge status: Home / Self Care | Payer: BC Managed Care – PPO | Attending: Internal Medicine | Admitting: Internal Medicine

## 2021-11-30 ENCOUNTER — Other Ambulatory Visit: Payer: Self-pay

## 2021-11-30 ENCOUNTER — Inpatient Hospital Stay (HOSPITAL_COMMUNITY): Payer: BC Managed Care – PPO

## 2021-11-30 ENCOUNTER — Encounter (HOSPITAL_COMMUNITY): Payer: Self-pay | Admitting: Emergency Medicine

## 2021-11-30 DIAGNOSIS — R9431 Abnormal electrocardiogram [ECG] [EKG]: Secondary | ICD-10-CM | POA: Diagnosis not present

## 2021-11-30 DIAGNOSIS — Z9049 Acquired absence of other specified parts of digestive tract: Secondary | ICD-10-CM | POA: Diagnosis not present

## 2021-11-30 DIAGNOSIS — K56609 Unspecified intestinal obstruction, unspecified as to partial versus complete obstruction: Secondary | ICD-10-CM | POA: Diagnosis not present

## 2021-11-30 DIAGNOSIS — Z4659 Encounter for fitting and adjustment of other gastrointestinal appliance and device: Secondary | ICD-10-CM | POA: Diagnosis not present

## 2021-11-30 DIAGNOSIS — E871 Hypo-osmolality and hyponatremia: Secondary | ICD-10-CM

## 2021-11-30 DIAGNOSIS — R748 Abnormal levels of other serum enzymes: Secondary | ICD-10-CM

## 2021-11-30 DIAGNOSIS — K760 Fatty (change of) liver, not elsewhere classified: Secondary | ICD-10-CM | POA: Diagnosis not present

## 2021-11-30 DIAGNOSIS — D751 Secondary polycythemia: Secondary | ICD-10-CM

## 2021-11-30 DIAGNOSIS — F419 Anxiety disorder, unspecified: Secondary | ICD-10-CM | POA: Diagnosis not present

## 2021-11-30 DIAGNOSIS — R109 Unspecified abdominal pain: Secondary | ICD-10-CM | POA: Diagnosis not present

## 2021-11-30 DIAGNOSIS — F3340 Major depressive disorder, recurrent, in remission, unspecified: Secondary | ICD-10-CM | POA: Diagnosis present

## 2021-11-30 DIAGNOSIS — F32A Depression, unspecified: Secondary | ICD-10-CM

## 2021-11-30 DIAGNOSIS — E8729 Other acidosis: Secondary | ICD-10-CM

## 2021-11-30 DIAGNOSIS — R1011 Right upper quadrant pain: Secondary | ICD-10-CM | POA: Diagnosis not present

## 2021-11-30 DIAGNOSIS — K5669 Other partial intestinal obstruction: Secondary | ICD-10-CM | POA: Diagnosis not present

## 2021-11-30 HISTORY — DX: Abnormal levels of other serum enzymes: R74.8

## 2021-11-30 HISTORY — DX: Anxiety disorder, unspecified: F41.9

## 2021-11-30 HISTORY — DX: Unspecified intestinal obstruction, unspecified as to partial versus complete obstruction: K56.609

## 2021-11-30 HISTORY — DX: Other acidosis: E87.29

## 2021-11-30 HISTORY — DX: Hypo-osmolality and hyponatremia: E87.1

## 2021-11-30 HISTORY — DX: Secondary polycythemia: D75.1

## 2021-11-30 HISTORY — DX: Depression, unspecified: F32.A

## 2021-11-30 HISTORY — DX: Acquired absence of other specified parts of digestive tract: Z90.49

## 2021-11-30 HISTORY — DX: Hypercalcemia: E83.52

## 2021-11-30 LAB — CBC WITH DIFFERENTIAL/PLATELET
Abs Immature Granulocytes: 0.04 10*3/uL (ref 0.00–0.07)
Basophils Absolute: 0.1 10*3/uL (ref 0.0–0.1)
Basophils Relative: 1 %
Eosinophils Absolute: 0.2 10*3/uL (ref 0.0–0.5)
Eosinophils Relative: 2 %
HCT: 46.7 % — ABNORMAL HIGH (ref 36.0–46.0)
Hemoglobin: 15.6 g/dL — ABNORMAL HIGH (ref 12.0–15.0)
Immature Granulocytes: 1 %
Lymphocytes Relative: 17 %
Lymphs Abs: 1.4 10*3/uL (ref 0.7–4.0)
MCH: 33.3 pg (ref 26.0–34.0)
MCHC: 33.4 g/dL (ref 30.0–36.0)
MCV: 99.6 fL (ref 80.0–100.0)
Monocytes Absolute: 0.6 10*3/uL (ref 0.1–1.0)
Monocytes Relative: 7 %
Neutro Abs: 5.7 10*3/uL (ref 1.7–7.7)
Neutrophils Relative %: 72 %
Platelets: 199 10*3/uL (ref 150–400)
RBC: 4.69 MIL/uL (ref 3.87–5.11)
RDW: 12.7 % (ref 11.5–15.5)
WBC: 7.9 10*3/uL (ref 4.0–10.5)
nRBC: 0 % (ref 0.0–0.2)

## 2021-11-30 LAB — LIPASE, BLOOD: Lipase: 23 U/L (ref 11–51)

## 2021-11-30 LAB — URINALYSIS, ROUTINE W REFLEX MICROSCOPIC
Bilirubin Urine: NEGATIVE
Glucose, UA: NEGATIVE mg/dL
Hgb urine dipstick: NEGATIVE
Ketones, ur: 80 mg/dL — AB
Leukocytes,Ua: NEGATIVE
Nitrite: NEGATIVE
Protein, ur: 100 mg/dL — AB
Specific Gravity, Urine: 1.032 — ABNORMAL HIGH (ref 1.005–1.030)
pH: 5 (ref 5.0–8.0)

## 2021-11-30 LAB — COMPREHENSIVE METABOLIC PANEL
ALT: 47 U/L — ABNORMAL HIGH (ref 0–44)
AST: 55 U/L — ABNORMAL HIGH (ref 15–41)
Albumin: 5.2 g/dL — ABNORMAL HIGH (ref 3.5–5.0)
Alkaline Phosphatase: 62 U/L (ref 38–126)
Anion gap: 19 — ABNORMAL HIGH (ref 5–15)
BUN: 12 mg/dL (ref 6–20)
CO2: 21 mmol/L — ABNORMAL LOW (ref 22–32)
Calcium: 11.1 mg/dL — ABNORMAL HIGH (ref 8.9–10.3)
Chloride: 92 mmol/L — ABNORMAL LOW (ref 98–111)
Creatinine, Ser: 0.9 mg/dL (ref 0.44–1.00)
GFR, Estimated: >60 mL/min (ref >60)
Glucose, Bld: 76 mg/dL (ref 70–99)
Potassium: 4.3 mmol/L (ref 3.5–5.1)
Sodium: 132 mmol/L — ABNORMAL LOW (ref 135–145)
Total Bilirubin: 2.2 mg/dL — ABNORMAL HIGH (ref 0.3–1.2)
Total Protein: 9 g/dL — ABNORMAL HIGH (ref 6.5–8.1)

## 2021-11-30 LAB — I-STAT BETA HCG BLOOD, ED (MC, WL, AP ONLY): I-stat hCG, quantitative: <5 m[IU]/mL (ref <5)

## 2021-11-30 MED ORDER — LACTATED RINGERS IV BOLUS
1000.0000 mL | Freq: Once | INTRAVENOUS | Status: AC
  Administered 2021-11-30: 1000 mL via INTRAVENOUS

## 2021-11-30 MED ORDER — ENOXAPARIN SODIUM 40 MG/0.4ML IJ SOSY
40.0000 mg | PREFILLED_SYRINGE | INTRAMUSCULAR | Status: DC
  Administered 2021-11-30 – 2021-12-01 (×2): 40 mg via SUBCUTANEOUS
  Filled 2021-11-30 (×3): qty 0.4

## 2021-11-30 MED ORDER — BENZOCAINE 20 % MT AERO
INHALATION_SPRAY | Freq: Once | OROMUCOSAL | Status: AC
  Administered 2021-11-30: 1 via OROMUCOSAL
  Filled 2021-11-30: qty 57

## 2021-11-30 MED ORDER — MORPHINE SULFATE (PF) 2 MG/ML IV SOLN
2.0000 mg | INTRAVENOUS | Status: DC | PRN
  Administered 2021-11-30: 2 mg via INTRAVENOUS
  Filled 2021-11-30: qty 1

## 2021-11-30 MED ORDER — IOHEXOL 300 MG/ML  SOLN
100.0000 mL | Freq: Once | INTRAMUSCULAR | Status: AC | PRN
  Administered 2021-11-30: 100 mL via INTRAVENOUS

## 2021-11-30 MED ORDER — HYDROMORPHONE HCL 1 MG/ML IJ SOLN
1.0000 mg | Freq: Once | INTRAMUSCULAR | Status: AC
  Administered 2021-11-30: 1 mg via INTRAVENOUS
  Filled 2021-11-30: qty 1

## 2021-11-30 MED ORDER — LIDOCAINE HCL URETHRAL/MUCOSAL 2 % EX GEL
1.0000 | Freq: Once | CUTANEOUS | Status: AC
  Administered 2021-11-30: 1 via TOPICAL
  Filled 2021-11-30: qty 11

## 2021-11-30 MED ORDER — ACETAMINOPHEN 325 MG PO TABS: 650.0000 mg | ORAL_TABLET | Freq: Four times a day (QID) | ORAL | Status: DC | PRN

## 2021-11-30 MED ORDER — ONDANSETRON HCL 4 MG/2ML IJ SOLN
4.0000 mg | Freq: Once | INTRAMUSCULAR | Status: AC
  Administered 2021-11-30: 4 mg via INTRAVENOUS
  Filled 2021-11-30: qty 2

## 2021-11-30 MED ORDER — ALBUTEROL SULFATE (2.5 MG/3ML) 0.083% IN NEBU: 2.5000 mg | INHALATION_SOLUTION | Freq: Four times a day (QID) | RESPIRATORY_TRACT | Status: DC | PRN

## 2021-11-30 MED ORDER — OXYMETAZOLINE HCL 0.05 % NA SOLN
1.0000 | Freq: Once | NASAL | Status: AC
  Administered 2021-11-30: 1 via NASAL
  Filled 2021-11-30: qty 30

## 2021-11-30 MED ORDER — ONDANSETRON HCL 4 MG/2ML IJ SOLN
4.0000 mg | Freq: Four times a day (QID) | INTRAMUSCULAR | Status: DC | PRN
  Administered 2021-11-30: 4 mg via INTRAVENOUS
  Filled 2021-11-30: qty 2

## 2021-11-30 MED ORDER — LACTATED RINGERS IV SOLN: INTRAVENOUS | Status: DC

## 2021-11-30 MED ORDER — MIDAZOLAM HCL 2 MG/2ML IJ SOLN
2.0000 mg | Freq: Once | INTRAMUSCULAR | Status: AC
  Administered 2021-11-30: 1 mg via INTRAVENOUS
  Filled 2021-11-30: qty 2

## 2021-11-30 MED ORDER — DIATRIZOATE MEGLUMINE & SODIUM 66-10 % PO SOLN
90.0000 mL | Freq: Once | ORAL | Status: AC
  Administered 2021-11-30: 90 mL via NASOGASTRIC
  Filled 2021-11-30: qty 90

## 2021-11-30 MED ORDER — PANTOPRAZOLE SODIUM 40 MG IV SOLR
40.0000 mg | INTRAVENOUS | Status: DC
  Administered 2021-11-30 – 2021-12-01 (×2): 40 mg via INTRAVENOUS
  Filled 2021-11-30 (×2): qty 10

## 2021-11-30 MED ORDER — LORAZEPAM 2 MG/ML IJ SOLN
0.5000 mg | Freq: Four times a day (QID) | INTRAMUSCULAR | Status: DC | PRN
Start: 2021-11-30 — End: 2021-12-01
  Administered 2021-11-30: 0.5 mg via INTRAVENOUS
  Filled 2021-11-30: qty 1

## 2021-11-30 MED ORDER — ONDANSETRON HCL 4 MG PO TABS: 4.0000 mg | ORAL_TABLET | Freq: Four times a day (QID) | ORAL | Status: DC | PRN

## 2021-11-30 MED ORDER — ACETAMINOPHEN 650 MG RE SUPP: 650.0000 mg | Freq: Four times a day (QID) | RECTAL | Status: DC | PRN

## 2021-11-30 NOTE — ED Notes (Signed)
Moved to [facility] 12 from h/w 13. Pending gastric delay imaging at 1930. Husband at [facility]. PT alert, NAD, calm, interactive. VSS. IVF infusing.

## 2021-11-30 NOTE — ED Provider Triage Note (Addendum)
Emergency Medicine Provider Triage Evaluation Note  Kristine Graves , a 36 y.o. female  was evaluated in triage.  Pt complains of lower abdominal pain, bloating, nausea, vomiting.  Dull, vague pain all day yesterday but worsening last evening and into this morning.  Has had several episodes of nonbloody, nonbilious emesis.  Has had prior appendectomy, no other abdominal surgeries.  Review of Systems  Positive: Abdominal pain, bloating, vomiting Negative: Fever  Physical Exam  BP (!) 130/96   Pulse 96   Temp 97.8 F (36.6 C)   Resp 16   SpO2 100%  Gen:   Awake, no distress   Resp:  Normal effort  MSK:   Moves extremities without difficulty  Other:    Medical Decision Making  Medically screening exam initiated at 2:25 AM.  Appropriate orders placed.  [name] was informed that the remainder of the evaluation will be completed by another provider, this initial triage assessment does not replace that evaluation, and the importance of remaining in the ED until their evaluation is complete.  Lower abdominal pain, vomiting.  Hx appendectomy in the past.  Will check labs, CT.  5:16 AM CT with findings of SBO.  Patient was updated.  Charge RN notified, will try to expedite bed placement.   Garlon Hatchet, PA-C 11/30/21 0226    Garlon Hatchet, PA-C 11/30/21 [phone]

## 2021-11-30 NOTE — Consult Note (Signed)
Reason for Consult:Small bowel obstruction Referring Physician: Arlyss Queen, MD, TRH  Kristine Graves is an 36 y.o. female.  HPI: This is a 36 year old female who is two years s/p laparoscopic appendectomy by Dr. Gaynelle Adu.  She was in her usual state of health until the last two days.  She last had a bowel movement on 7/20.  She describes slowly worsening RLQ abdominal pain, associated with distention.  Eventually, she began having some nausea and vomiting.  Unable to keep anything down.  She presented to the ED for evaluation.  CT scan reveals signs consistent with SBO at her previous appendectomy site.  She feels much better after NG tube placement.  Past Medical History:  Diagnosis Date   Anxiety    Blood transfusion without reported diagnosis    at birth RH incompatibility   Depression     Past Surgical History:  Procedure Laterality Date   BREAST ENHANCEMENT SURGERY     LAPAROSCOPIC APPENDECTOMY N/A 06/09/2019   Procedure: APPENDECTOMY LAPAROSCOPIC;  Surgeon: Gaynelle Adu, MD;  Location: [facility] OR;  Service: General;  Laterality: N/A;   NASAL SINUS SURGERY      Family History  Problem Relation Age of Onset   Hypertension Father    Graves' disease Maternal Grandmother    Diabetes Maternal Grandfather    Heart attack Paternal Grandfather    Diabetes Paternal Grandfather     Social History:  reports that she has never smoked. She has never used smokeless tobacco. She reports that she does not currently use alcohol. She reports that she does not use drugs.  Allergies: No Known Allergies  Medications:  Prior to Admission medications   Medication Sig Start Date End Date Taking? Authorizing Provider  acetaminophen (TYLENOL) 500 MG tablet You can take 1000 mg of Tylenol/acetaminophen every 8 hours as needed for pain.  For the next 24-48hrs we recommend you just take it on a schedule.  As pain improves you can go back to using it as needed.  You can buy this over-the-counter at any  drugstore.  Do not exceed 4000 mg of Tylenol per day it can harm your liver. 06/10/19  Yes Sherrie George, PA-C  buPROPion (WELLBUTRIN XL) 150 MG 24 hr tablet Take 150 mg by mouth every morning. 07/27/21  Yes [provider]  cholecalciferol (VITAMIN D3) 25 MCG (1000 UNIT) tablet Take 1,000 Units by mouth daily.   Yes [provider]  escitalopram (LEXAPRO) 10 MG tablet Take 10 mg by mouth daily. 06/07/19  Yes [provider]  hydrOXYzine (ATARAX) 10 MG tablet Take 10 mg by mouth daily as needed for anxiety.   Yes [provider]  ibuprofen (ADVIL) 200 MG tablet You can take 2 to 3 tablets every 6 hours as needed for pain.  This is your second pain control medication.  You can alternate this with plain Tylenol/acetaminophen.  You can buy this over-the-counter at any drugstore. 06/10/19  Yes Sherrie George, PA-C  Multiple Vitamins-Minerals (MULTIVITAMIN WITH MINERALS) tablet Take 1 tablet by mouth daily.   Yes [provider]     Results for orders placed or performed during the hospital encounter of 11/30/21 (from the past 48 hour(s))  CBC with Differential     Status: Abnormal   Collection Time: 11/30/21  2:27 AM  Result Value Ref Range   WBC 7.9 4.0 - 10.5 K/uL   RBC 4.69 3.87 - 5.11 MIL/uL   Hemoglobin 15.6 (H) 12.0 - 15.0 g/dL  HCT 46.7 (H) 36.0 - 46.0 %   MCV 99.6 80.0 - 100.0 fL   MCH 33.3 26.0 - 34.0 pg   MCHC 33.4 30.0 - 36.0 g/dL   RDW 32.9 51.8 - 84.1 %   Platelets 199 150 - 400 K/uL   nRBC 0.0 0.0 - 0.2 %   Neutrophils Relative % 72 %   Neutro Abs 5.7 1.7 - 7.7 K/uL   Lymphocytes Relative 17 %   Lymphs Abs 1.4 0.7 - 4.0 K/uL   Monocytes Relative 7 %   Monocytes Absolute 0.6 0.1 - 1.0 K/uL   Eosinophils Relative 2 %   Eosinophils Absolute 0.2 0.0 - 0.5 K/uL   Basophils Relative 1 %   Basophils Absolute 0.1 0.0 - 0.1 K/uL   Immature Granulocytes 1 %   Abs Immature Granulocytes 0.04 0.00 - 0.07 K/uL    Comment: Performed at  [facility], [address], [place], [place] [address]  Comprehensive metabolic panel     Status: Abnormal   Collection Time: 11/30/21  2:27 AM  Result Value Ref Range   Sodium 132 (L) 135 - 145 mmol/L   Potassium 4.3 3.5 - 5.1 mmol/L   Chloride 92 (L) 98 - 111 mmol/L   CO2 21 (L) 22 - 32 mmol/L   Glucose, Bld 76 70 - 99 mg/dL    Comment: Glucose reference range applies only to samples taken after fasting for at least 8 hours.   BUN 12 6 - 20 mg/dL   Creatinine, Ser 0.16 0.44 - 1.00 mg/dL   Calcium 01.0 (H) 8.9 - 10.3 mg/dL   Total Protein 9.0 (H) 6.5 - 8.1 g/dL   Albumin 5.2 (H) 3.5 - 5.0 g/dL   AST 55 (H) 15 - 41 U/L   ALT 47 (H) 0 - 44 U/L   Alkaline Phosphatase 62 38 - 126 U/L   Total Bilirubin 2.2 (H) 0.3 - 1.2 mg/dL   GFR, Estimated >93 >23 mL/min    Comment: (NOTE) Calculated using the CKD-EPI Creatinine Equation (2021)    Anion gap 19 (H) 5 - 15    Comment: Performed at [facility], [address], [place], [place] [address]  Lipase, blood     Status: None   Collection Time: 11/30/21  2:27 AM  Result Value Ref Range   Lipase 23 11 - 51 U/L    Comment: Performed at [facility], [address], [place], [place] [address]  I-Stat Beta hCG blood, ED ([facility], [facility], [facility] only)     Status: None   Collection Time: 11/30/21  2:47 AM  Result Value Ref Range   I-stat hCG, quantitative <5.0 <5 mIU/mL   Comment 3            Comment:   GEST. AGE      CONC.  (mIU/mL)   <=1 WEEK        5 - 50     2 WEEKS       50 - 500     3 WEEKS       100 - 10,000     4 WEEKS     1,000 - 30,000        FEMALE AND NON-PREGNANT FEMALE:     LESS THAN 5 mIU/mL   Urinalysis, Routine w reflex microscopic Urine, Clean Catch     Status: Abnormal   Collection Time: 11/30/21  2:50 AM  Result Value Ref Range   Color, Urine  AMBER (A) YELLOW    Comment: BIOCHEMICALS MAY BE AFFECTED BY COLOR   APPearance HAZY (A) CLEAR   Specific Gravity, Urine 1.032 (H) 1.005 - 1.030   pH 5.0 5.0 - 8.0    Glucose, UA NEGATIVE NEGATIVE mg/dL   Hgb urine dipstick NEGATIVE NEGATIVE   Bilirubin Urine NEGATIVE NEGATIVE   Ketones, ur 80 (A) NEGATIVE mg/dL   Protein, ur 259 (A) NEGATIVE mg/dL   Nitrite NEGATIVE NEGATIVE   Leukocytes,Ua NEGATIVE NEGATIVE   RBC / HPF 0-5 0 - 5 RBC/hpf   WBC, UA 11-20 0 - 5 WBC/hpf   Bacteria, UA RARE (A) NONE SEEN   Squamous Epithelial / LPF 0-5 0 - 5   Mucus PRESENT    Hyaline Casts, UA PRESENT     Comment: Performed at [facility], [address] [place], [place] [address]    DG Abd Portable 1V  Result Date: 11/30/2021 CLINICAL DATA:  NG tube placement, small-bowel obstruction. EXAM: PORTABLE ABDOMEN - 1 VIEW COMPARISON:  CT 11/30/2021 FINDINGS: Nasogastric tube is present as it is looped once within the stomach as tip is over the left mid abdomen. Air is present throughout the colon. No definite dilated small bowel loops visualized. No free peritoneal air. Surgical clips over the right lower quadrant. IUD is present in adequate position. Moderate contrast over the bladder from recent CT scan. No focal bony abnormality. IMPRESSION: Nonobstructive bowel gas pattern. Nasogastric tube looped once within the stomach with tip over the left mid abdomen. Electronically Signed   By: Elberta Fortis M.D.   On: 11/30/2021 09:41   CT ABDOMEN PELVIS W CONTRAST  Result Date: 11/30/2021 CLINICAL DATA:  Left lower quadrant pain with bloating EXAM: CT ABDOMEN AND PELVIS WITH CONTRAST TECHNIQUE: Multidetector CT imaging of the abdomen and pelvis was performed using the standard protocol following bolus administration of intravenous contrast. RADIATION DOSE REDUCTION: This exam was performed according to the departmental dose-optimization program which includes automated exposure control, adjustment of the mA and/or kV according to patient size and/or use of iterative reconstruction technique. CONTRAST:  OMNIPAQUE IOHEXOL 300 MG/ML  SOLN COMPARISON:  06/09/2019 FINDINGS:  Lower chest:  No contributory findings. Hepatobiliary: Hepatic steatosis.No evidence of biliary obstruction or stone. Pancreas: Unremarkable. Spleen: Unremarkable. Adrenals/Urinary Tract: Negative adrenals. No hydronephrosis or stone. Unremarkable bladder. Stomach/Bowel: Dilated and fluid-filled small bowel loops leading to a transition point in the right lower quadrant where there is twisting of small bowel around clips for appendectomy. Vascular/Lymphatic: No acute vascular abnormality. No mass or adenopathy. Reproductive:Located IUD. Other: No ascites or pneumoperitoneum. Musculoskeletal: No acute abnormalities. IMPRESSION: High-grade small bowel obstruction with transition point near the patient's prior appendectomy site. Hepatic steatosis. Electronically Signed   By: Tiburcio Pea M.D.   On: 11/30/2021 05:11    Review of Systems  Constitutional:  Negative for chills, fever and unexpected weight change.  HENT:  Negative for congestion, ear discharge, ear pain, hearing loss, sore throat, tinnitus, trouble swallowing and voice change.   Eyes:  Negative for photophobia, pain and visual disturbance.  Respiratory:  Negative for cough, shortness of breath and wheezing.   Cardiovascular:  Negative for chest pain, palpitations and leg swelling.  Gastrointestinal:  Positive for abdominal distention, abdominal pain, nausea and vomiting. Negative for anal bleeding, blood in stool, constipation and diarrhea.  Genitourinary:  Negative for difficulty urinating, dysuria, flank pain, frequency, hematuria, urgency and vaginal bleeding.  Musculoskeletal:  Negative for arthralgias, back pain, myalgias and neck pain.  Skin:  Negative for rash and wound.  Neurological:  Negative for dizziness, seizures, syncope and headaches.  Hematological:  Negative for adenopathy. Does not bruise/bleed easily.  Psychiatric/Behavioral:  Negative for confusion. The patient is not nervous/anxious.    Blood pressure 120/83, pulse  87, temperature 98 F (36.7 C), resp. rate 13, SpO2 97 %, unknown if currently breastfeeding. Physical Exam Constitutional:  WDWN in NAD, conversant, no obvious deformities; lying in bed comfortably Eyes:  Pupils equal, round; sclera anicteric; moist conjunctiva; no lid lag HENT:  Oral mucosa moist; good dentition: NG tube in place - to LIWS Neck:  No masses palpated, trachea midline; no thyromegaly Lungs:  CTA bilaterally; normal respiratory effort CV:  Regular rate and rhythm; no murmurs; extremities well-perfused with no edema Abd:  +bowel sounds, soft, mildly distended; mild RLQ tenderness without peritonitis Musc:  Unable to assess gait; no apparent clubbing or cyanosis in extremities Lymphatic:  No palpable cervical or axillary lymphadenopathy Skin:  Warm, dry; no sign of jaundice Psychiatric - alert and oriented x 4; calm mood and affect   Assessment/Plan: Small bowel obstruction - likely secondary to adhesions from laparoscopic appendectomy in 2021.  Recs:   NPO x ice chips NG tube to suction  SBO protocol If obstruction does not resolve, may need diagnostic laparoscopy with lysis of adhesions, as it appears the obstruction is at the site of her previous appendectomy.  Wilmon Arms Ala Kratz 11/30/2021, 10:41 AM

## 2021-11-30 NOTE — ED Provider Notes (Signed)
[facility] EMERGENCY DEPARTMENT Provider Note   CSN: [id number] Arrival date & time: 11/30/21  0156     History PMHx significant for appendectomy in 2021 Chief Complaint  Patient presents with   Abdominal Pain    Kristine Graves is a 36 y.o. female.  36 y.o. female w/ PMHx significant for appendectomy in 2021 presents with constant abdominal pain since the evening of 7/20 she describes as intense and aching and located in right upper quadrant and rates at 10/10 at its worst, currently at 6/10. She admits to non bloody vomiting since early morning hours.  Denies diarrhea.  Has not been able to eat or drink much since pain started.    Abdominal Pain Associated symptoms: nausea and vomiting   Associated symptoms: no chest pain, no chills, no diarrhea, no fever and no shortness of breath        Home Medications Prior to Admission medications   Medication Sig Start Date End Date Taking? Authorizing Provider  acetaminophen (TYLENOL) 500 MG tablet You can take 1000 mg of Tylenol/acetaminophen every 8 hours as needed for pain.  For the next 24-48hrs we recommend you just take it on a schedule.  As pain improves you can go back to using it as needed.  You can buy this over-the-counter at any drugstore.  Do not exceed 4000 mg of Tylenol per day it can harm your liver. 06/10/19   Sherrie George, PA-C  escitalopram (LEXAPRO) 10 MG tablet Take 10 mg by mouth daily. 06/07/19   [provider]  ibuprofen (ADVIL) 200 MG tablet You can take 2 to 3 tablets every 6 hours as needed for pain.  This is your second pain control medication.  You can alternate this with plain Tylenol/acetaminophen.  You can buy this over-the-counter at any drugstore. 06/10/19   Sherrie George, PA-C  Multiple Vitamins-Minerals (MULTIVITAMIN WITH MINERALS) tablet Take 1 tablet by mouth daily.    [provider]  oxyCODONE (OXY IR/ROXICODONE) 5 MG immediate release tablet 1 tablet  every 6 hours as needed for pain not relieved by Tylenol/ibuprofen 06/10/19   Sherrie George, PA-C      Allergies    Patient has no known allergies.    Review of Systems   Review of Systems  Constitutional:  Negative for chills and fever.  Respiratory:  Negative for shortness of breath.   Cardiovascular:  Negative for chest pain.  Gastrointestinal:  Positive for abdominal pain, nausea and vomiting. Negative for blood in stool and diarrhea.  Neurological:  Positive for light-headedness.    Physical Exam Updated Vital Signs BP 118/89 (BP Location: Right Arm)   Pulse 70   Temp 98.1 F (36.7 C) (Oral)   Resp 20   LMP  (LMP Unknown) Comment: Patient with IUD - Merana  SpO2 100%  Physical Exam Constitutional:      General: She is not in acute distress.    Appearance: She is not ill-appearing, toxic-appearing or diaphoretic.  HENT:     Mouth/Throat:     Mouth: Mucous membranes are dry.  Cardiovascular:     Rate and Rhythm: Normal rate and regular rhythm.  Pulmonary:     Effort: Pulmonary effort is normal.     Breath sounds: Normal breath sounds.  Abdominal:     General: Abdomen is flat. Bowel sounds are normal. There is no distension.     Palpations: Abdomen is soft.     Tenderness: There is abdominal tenderness in the right lower quadrant  and left lower quadrant.  Skin:    General: Skin is warm and dry.  Neurological:     General: No focal deficit present.     Mental Status: She is alert.  Psychiatric:        Mood and Affect: Mood normal.        Behavior: Behavior normal.     ED Results / Procedures / Treatments   Labs (all labs ordered are listed, but only abnormal results are displayed) Labs Reviewed  CBC WITH DIFFERENTIAL/PLATELET - Abnormal; Notable for the following components:      Result Value   Hemoglobin 15.6 (*)    HCT 46.7 (*)    All other components within normal limits  COMPREHENSIVE METABOLIC PANEL - Abnormal; Notable for the following components:    Sodium 132 (*)    Chloride 92 (*)    CO2 21 (*)    Calcium 11.1 (*)    Total Protein 9.0 (*)    Albumin 5.2 (*)    AST 55 (*)    ALT 47 (*)    Total Bilirubin 2.2 (*)    Anion gap 19 (*)    All other components within normal limits  URINALYSIS, ROUTINE W REFLEX MICROSCOPIC - Abnormal; Notable for the following components:   Color, Urine AMBER (*)    APPearance HAZY (*)    Specific Gravity, Urine 1.032 (*)    Ketones, ur 80 (*)    Protein, ur 100 (*)    Bacteria, UA RARE (*)    All other components within normal limits  LIPASE, BLOOD  I-STAT BETA HCG BLOOD, ED ([facility], [facility], [facility] ONLY)    EKG None  Radiology CT ABDOMEN PELVIS W CONTRAST  Result Date: 11/30/2021 CLINICAL DATA:  Left lower quadrant pain with bloating EXAM: CT ABDOMEN AND PELVIS WITH CONTRAST TECHNIQUE: Multidetector CT imaging of the abdomen and pelvis was performed using the standard protocol following bolus administration of intravenous contrast. RADIATION DOSE REDUCTION: This exam was performed according to the departmental dose-optimization program which includes automated exposure control, adjustment of the mA and/or kV according to patient size and/or use of iterative reconstruction technique. CONTRAST:  OMNIPAQUE IOHEXOL 300 MG/ML  SOLN COMPARISON:  06/09/2019 FINDINGS: Lower chest:  No contributory findings. Hepatobiliary: Hepatic steatosis.No evidence of biliary obstruction or stone. Pancreas: Unremarkable. Spleen: Unremarkable. Adrenals/Urinary Tract: Negative adrenals. No hydronephrosis or stone. Unremarkable bladder. Stomach/Bowel: Dilated and fluid-filled small bowel loops leading to a transition point in the right lower quadrant where there is twisting of small bowel around clips for appendectomy. Vascular/Lymphatic: No acute vascular abnormality. No mass or adenopathy. Reproductive:Located IUD. Other: No ascites or pneumoperitoneum. Musculoskeletal: No acute abnormalities. IMPRESSION: High-grade small bowel  obstruction with transition point near the patient's prior appendectomy site. Hepatic steatosis. Electronically Signed   By: Tiburcio Pea M.D.   On: 11/30/2021 05:11    Procedures None  Medications Ordered in ED Medications  iohexol (OMNIPAQUE) 300 MG/ML solution 100 mL (100 mLs Intravenous Contrast Given 11/30/21 0455)    ED Course/ Medical Decision Making/ A&P                           Medical Decision Making 37 y.o. female s/p appendectomy in 2021 presents with severe abdominal pain and found to have high grade SBO on CT abdomen today showing transition point near appendectomy site. VSS, CBC, and CMP unremarkable, UA with no evidence of UTI and showing likely dehydration with elevated  specific gravity and 80 ketones.  She was given 1 L LR bolus, Dilaudid 1 mg IV, Zofran 4 mg IV, and Versed 2 mg IV prior to NG tube placement. Patient will need admission for further work-up.  General surgery has been consulted and will see patient.   Risk OTC drugs. Prescription drug management. Decision regarding hospitalization.      Final Clinical Impression(s) / ED Diagnoses Final diagnoses:  Small bowel obstruction [facility])    Rx / DC Orders ED Discharge Orders     None         Kahlen, Boyde, DO 11/30/21 1009    Melene Plan, DO 11/30/21 1221

## 2021-11-30 NOTE — ED Triage Notes (Signed)
Patient with abdominal pain that started last night.  She states that she has been vomiting, no fever.  Patient does have some nausea at this time.  Patient states that she feels bloated all over.  No other abdominal problems.  Patient denies pregnancy.

## 2021-11-30 NOTE — ED Notes (Addendum)
Contrast in, NGT clamped. Imaging study scheduled for 8 hr delay, at ~1930. Pt alert, NAD, calm. VSS. HOB 30 degrees.

## 2021-11-30 NOTE — ED Notes (Addendum)
Reports "starting to pass gas", stomach improved, NGT pain worse. Husband at [facility].

## 2021-11-30 NOTE — H&P (Signed)
History and Physical    Patient: Kristine Graves [id number]:[id number] DOB: Nov 22, 1985 DOA: 11/30/2021 DOS: the patient was seen and examined on 11/30/2021 PCP: Mila Palmer, MD  Patient coming from: Home  Chief Complaint:  Chief Complaint  Patient presents with   Abdominal Pain   HPI: Kristine Graves is a 36 y.o. female with medical history significant of anxiety, depression, and prior laparoscopic appendectomy in 2021 who presents with complaints of abdominal pain which started 2 days ago.  Pain was in her right quadrant of her abdomen and described as achy and severe.  Pain worsened with palpation of her stomach.  Associated symptoms included abdominal bloating and frequent belching.  She was not able to eat or drink much of anything.Marland Kitchen  Her last bowel movement was approximately 2 days ago and she has not been able to pass gas recently to her knowledge.  This morning around 1:30 she developed nausea and vomiting.  In the emergency department patient was noted to have stable vital signs.  Labs significant for hemoglobin 15.6,'s odium 132, CO2 21, calcium gap 19, AST 55, ALT 47, total bilirubin 2.2, and lipase 23.  Urinalysis noted elevated specific gravity with rare bacteria, and 11-20 WBCs.  CT scan of the abdomen pelvis revealed high-grade small bowel obstruction with transition point near patient's prior appendectomy site.  General surgery has been consulted.  Orders placed to place NG tube.  Patient had been given 1 L of lactated Ringer's, antiemetics, and Dilaudid IV while in the ED.  Review of Systems: As mentioned in the history of present illness. All other systems reviewed and are negative. Past Medical History:  Diagnosis Date   Anxiety    Blood transfusion without reported diagnosis    at birth RH incompatibility   Depression    Past Surgical History:  Procedure Laterality Date   BREAST ENHANCEMENT SURGERY     LAPAROSCOPIC APPENDECTOMY N/A 06/09/2019   Procedure:  APPENDECTOMY LAPAROSCOPIC;  Surgeon: Gaynelle Adu, MD;  Location: [facility] OR;  Service: General;  Laterality: N/A;   NASAL SINUS SURGERY     Social History:  reports that she has never smoked. She has never used smokeless tobacco. She reports that she does not currently use alcohol. She reports that she does not use drugs.  No Known Allergies  Family History  Problem Relation Age of Onset   Hypertension Father    Graves' disease Maternal Grandmother    Diabetes Maternal Grandfather    Heart attack Paternal Grandfather    Diabetes Paternal Grandfather     Prior to Admission medications   Medication Sig Start Date End Date Taking? Authorizing Provider  acetaminophen (TYLENOL) 500 MG tablet You can take 1000 mg of Tylenol/acetaminophen every 8 hours as needed for pain.  For the next 24-48hrs we recommend you just take it on a schedule.  As pain improves you can go back to using it as needed.  You can buy this over-the-counter at any drugstore.  Do not exceed 4000 mg of Tylenol per day it can harm your liver. 06/10/19  Yes Sherrie George, PA-C  buPROPion (WELLBUTRIN XL) 150 MG 24 hr tablet Take 150 mg by mouth every morning. 07/27/21  Yes [provider]  cholecalciferol (VITAMIN D3) 25 MCG (1000 UNIT) tablet Take 1,000 Units by mouth daily.   Yes [provider]  escitalopram (LEXAPRO) 10 MG tablet Take 10 mg by mouth daily. 06/07/19  Yes [provider]  hydrOXYzine (ATARAX) 10 MG tablet Take 10 mg  by mouth daily as needed for anxiety.   Yes [provider]  ibuprofen (ADVIL) 200 MG tablet You can take 2 to 3 tablets every 6 hours as needed for pain.  This is your second pain control medication.  You can alternate this with plain Tylenol/acetaminophen.  You can buy this over-the-counter at any drugstore. 06/10/19  Yes Sherrie George, PA-C  Multiple Vitamins-Minerals (MULTIVITAMIN WITH MINERALS) tablet Take 1 tablet by mouth daily.   Yes [provider]   oxyCODONE (OXY IR/ROXICODONE) 5 MG immediate release tablet 1 tablet every 6 hours as needed for pain not relieved by Tylenol/ibuprofen Patient not taking: Reported on 11/30/2021 06/10/19   Sherrie George, PA-C    Physical Exam: Vitals:   11/30/21 0205 11/30/21 0518 11/30/21 0816  BP: (!) 130/96 118/89 126/85  Pulse: 96 70 94  Resp: 16 20 17   Temp: 97.8 F (36.6 C) 98.1 F (36.7 C) 98 F (36.7 C)  TempSrc:  Oral   SpO2: 100% 100% 99%   Exam  Constitutional: Middle-aged female who appears to be in no acute distress Eyes: PERRL, lids and conjunctivae normal ENMT: Mucous membranes are moist.  NGT to suction Neck: normal, supple  Respiratory: clear to auscultation bilaterally, no wheezing, no crackles. Normal respiratory effort. No accessory muscle use.  Cardiovascular: Regular rate and rhythm, no murmurs / rubs / gallops. No extremity edema.   Abdomen: Nondistended without significant tenderness palpation currently the patient had received pain medications.  Bowel sounds are decreased Musculoskeletal: no clubbing / cyanosis. No joint deformity upper and lower extremities. Good ROM, no contractures. Normal muscle tone.  Skin: no rashes, lesions, ulcers. No induration Neurologic: CN 2-12 grossly intact.  Strength 5/5 in all 4.  Psychiatric: Normal judgment and insight. Alert and oriented x 3. Normal mood.   Data Reviewed:  EKG revealed sinus rhythm at 96 bpm with borderline prolonged QTc of 486.  Reviewed labs, imaging and pertinent records as noted above in HPI.  Assessment and Plan: Small bowel obstruction secondary to history of appendectomy Patient presented with complaints of abdominal pain with nausea and vomiting.  Prior history of laparoscopic appendectomy in 2021.  CT scan of the abdomen and pelvis noted high-grade obstruction near area of previous appendectomy.  Orders have been placed for replacement of NG tube and general surgery have been consulted. -Admit to a  MedSurg bed -Monitor intake and output -N.p.o. -Continue NGT to suction -Normal saline IV fluids at 100 mL/h -Morphine IV as needed for pain -Protonix IV for GI prophylaxis -Appreciate general surgery consultative services, we will follow-up for any further recommendations.  Metabolic acidosis with increased anion gap Acute.  On admission CO2 21 with anion gap of 19.  Glucose was noted to be within normal limits and symptoms not related to DKA.  Possibly related to a lactic acid doses given dehydration. -Continue to monitor for correction with IV fluids  Hyponatremia Acute.  Sodium 132.  Suspect hypovolemic hyponatremia given nausea and vomiting, but patient is also on Lexapro which could be a contributor to symptoms. -Continue IV fluids -Recheck sodium levels in a.m.  Hypercalcemia Acute.  Calcium level elevated up to 11.1.  Suspect this is secondary to dehydration nausea and vomiting as levels previously had been within normal limits. -Continue to monitor calcium levels which should correct with IV fluid  Polycythemia Acute.  Hemoglobin elevated at 15.6.  Thought to be secondary to hemoconcentration in setting of nausea and vomiting and poor p.o. intake. -Recheck CBC tomorrow morning  Elevated liver enzymes Acute. AST 55, ALT 47, and total bilirubin 2.2.  Thought to be reactive in nature. -Recheck labs in a.m.  Borderline prolonged QT interval QTc was noted to be 487 on admission. -Orders placed to recheck EKG tomorrow morning.  Anxiety and depression Home medication regimen includes Lexapro, Wellbutrin, and hydroxyzine. -Holding home medication regimen -Low-dose Ativan IV as needed for anxiety   DVT prophylaxis: Lovenox Advance Care Planning:   Code Status: Full Code   Consults: General surgery  Family Communication: Husband updated at bedside Severity of Illness: The appropriate patient status for this patient is INPATIENT. Inpatient status is judged to be  reasonable and necessary in order to provide the required intensity of service to ensure the patient's safety. The patient's presenting symptoms, physical exam findings, and initial radiographic and laboratory data in the context of their chronic comorbidities is felt to place them at high risk for further clinical deterioration. Furthermore, it is not anticipated that the patient will be medically stable for discharge from the hospital within 2 midnights of admission.   * I certify that at the point of admission it is my clinical judgment that the patient will require inpatient hospital care spanning beyond 2 midnights from the point of admission due to high intensity of service, high risk for further deterioration and high frequency of surveillance required.*  Author: Clydie Braun, MD 11/30/2021 8:55 AM  For on call review [email or web address].

## 2021-12-01 DIAGNOSIS — Z9049 Acquired absence of other specified parts of digestive tract: Secondary | ICD-10-CM | POA: Diagnosis not present

## 2021-12-01 DIAGNOSIS — K56609 Unspecified intestinal obstruction, unspecified as to partial versus complete obstruction: Secondary | ICD-10-CM | POA: Diagnosis not present

## 2021-12-01 DIAGNOSIS — R748 Abnormal levels of other serum enzymes: Secondary | ICD-10-CM | POA: Diagnosis not present

## 2021-12-01 DIAGNOSIS — F419 Anxiety disorder, unspecified: Secondary | ICD-10-CM | POA: Diagnosis not present

## 2021-12-01 DIAGNOSIS — K5669 Other partial intestinal obstruction: Secondary | ICD-10-CM | POA: Diagnosis not present

## 2021-12-01 LAB — COMPREHENSIVE METABOLIC PANEL
ALT: 30 U/L (ref 0–44)
AST: 27 U/L (ref 15–41)
Albumin: 3.9 g/dL (ref 3.5–5.0)
Alkaline Phosphatase: 48 U/L (ref 38–126)
Anion gap: 14 (ref 5–15)
BUN: 13 mg/dL (ref 6–20)
CO2: 25 mmol/L (ref 22–32)
Calcium: 9.4 mg/dL (ref 8.9–10.3)
Chloride: 102 mmol/L (ref 98–111)
Creatinine, Ser: 0.75 mg/dL (ref 0.44–1.00)
GFR, Estimated: >60 mL/min (ref >60)
Glucose, Bld: 70 mg/dL (ref 70–99)
Potassium: 3.8 mmol/L (ref 3.5–5.1)
Sodium: 141 mmol/L (ref 135–145)
Total Bilirubin: 1.4 mg/dL — ABNORMAL HIGH (ref 0.3–1.2)
Total Protein: 6.8 g/dL (ref 6.5–8.1)

## 2021-12-01 LAB — CBC
HCT: 37.7 % (ref 36.0–46.0)
Hemoglobin: 12.5 g/dL (ref 12.0–15.0)
MCH: 33.4 pg (ref 26.0–34.0)
MCHC: 33.2 g/dL (ref 30.0–36.0)
MCV: 100.8 fL — ABNORMAL HIGH (ref 80.0–100.0)
Platelets: 130 10*3/uL — ABNORMAL LOW (ref 150–400)
RBC: 3.74 MIL/uL — ABNORMAL LOW (ref 3.87–5.11)
RDW: 12.7 % (ref 11.5–15.5)
WBC: 3.9 10*3/uL — ABNORMAL LOW (ref 4.0–10.5)
nRBC: 0 % (ref 0.0–0.2)

## 2021-12-01 LAB — HIV ANTIBODY (ROUTINE TESTING W REFLEX): HIV Screen 4th Generation wRfx: NONREACTIVE

## 2021-12-01 NOTE — Progress Notes (Signed)
Subjective: Doing well.  + flatus and diarrhea.  Still with mild tenderness as expected.  ROS: See above, otherwise other systems negative  Objective: Vital signs in last 24 hours: Temp:  [97.8 F (36.6 C)-98.1 F (36.7 C)] 97.8 F (36.6 C) (07/23 0524) Pulse Rate:  [65-96] 79 (07/23 0524) Resp:  [12-25] 19 (07/23 0524) BP: (105-134)/(74-104) 128/88 (07/23 0524) SpO2:  [94 %-100 %] 97 % (07/23 0524)    Intake/Output from previous day: 07/22 0701 - 07/23 0700 In: 1000 [IV Piggyback:1000] Out: 450  Intake/Output this shift: No intake/output data recorded.  PE: Gen: NAD Abd: soft, minimally tender, +BS, ND  Lab Results:  Recent Labs    11/30/21 0227 12/01/21 0531  WBC 7.9 3.9*  HGB 15.6* 12.5  HCT 46.7* 37.7  PLT 199 130*   BMET Recent Labs    11/30/21 0227 12/01/21 0531  NA 132* 141  K 4.3 3.8  CL 92* 102  CO2 21* 25  GLUCOSE 76 70  BUN 12 13  CREATININE 0.90 0.75  CALCIUM 11.1* 9.4   PT/INR No results for input(s): "LABPROT", "INR" in the last 72 hours. CMP     Component Value Date/Time   NA 141 12/01/2021 0531   K 3.8 12/01/2021 0531   CL 102 12/01/2021 0531   CO2 25 12/01/2021 0531   GLUCOSE 70 12/01/2021 0531   BUN 13 12/01/2021 0531   CREATININE 0.75 12/01/2021 0531   CALCIUM 9.4 12/01/2021 0531   PROT 6.8 12/01/2021 0531   ALBUMIN 3.9 12/01/2021 0531   AST 27 12/01/2021 0531   ALT 30 12/01/2021 0531   ALKPHOS 48 12/01/2021 0531   BILITOT 1.4 (H) 12/01/2021 0531   GFRNONAA >60 12/01/2021 0531   GFRAA >60 06/09/2019 1700   Lipase     Component Value Date/Time   LIPASE 23 11/30/2021 0227       Studies/Results: DG Abd Portable 1V-Small Bowel Obstruction Protocol-initial, 8 hr delay  Result Date: 11/30/2021 CLINICAL DATA:  Small-bowel obstruction, 8 hour film EXAM: PORTABLE ABDOMEN - 1 VIEW COMPARISON:  11/30/2021, 9:29 a.m. FINDINGS: The colon is contrast opacified to the rectum. The small bowel is gas-filled although  nondistended. Esophagogastric tube with tip and side port below the diaphragm. Excreted contrast in the urinary bladder. IMPRESSION: The colon is contrast opacified to the rectum. The small bowel is gas-filled although nondistended. Electronically Signed   By: Jearld Lesch M.D.   On: 11/30/2021 19:44   DG Abd Portable 1V  Result Date: 11/30/2021 CLINICAL DATA:  NG tube placement, small-bowel obstruction. EXAM: PORTABLE ABDOMEN - 1 VIEW COMPARISON:  CT 11/30/2021 FINDINGS: Nasogastric tube is present as it is looped once within the stomach as tip is over the left mid abdomen. Air is present throughout the colon. No definite dilated small bowel loops visualized. No free peritoneal air. Surgical clips over the right lower quadrant. IUD is present in adequate position. Moderate contrast over the bladder from recent CT scan. No focal bony abnormality. IMPRESSION: Nonobstructive bowel gas pattern. Nasogastric tube looped once within the stomach with tip over the left mid abdomen. Electronically Signed   By: Elberta Fortis M.D.   On: 11/30/2021 09:41   CT ABDOMEN PELVIS W CONTRAST  Result Date: 11/30/2021 CLINICAL DATA:  Left lower quadrant pain with bloating EXAM: CT ABDOMEN AND PELVIS WITH CONTRAST TECHNIQUE: Multidetector CT imaging of the abdomen and pelvis was performed using the standard protocol following bolus administration of intravenous contrast. RADIATION DOSE  REDUCTION: This exam was performed according to the departmental dose-optimization program which includes automated exposure control, adjustment of the mA and/or kV according to patient size and/or use of iterative reconstruction technique. CONTRAST:  OMNIPAQUE IOHEXOL 300 MG/ML  SOLN COMPARISON:  06/09/2019 FINDINGS: Lower chest:  No contributory findings. Hepatobiliary: Hepatic steatosis.No evidence of biliary obstruction or stone. Pancreas: Unremarkable. Spleen: Unremarkable. Adrenals/Urinary Tract: Negative adrenals. No hydronephrosis or  stone. Unremarkable bladder. Stomach/Bowel: Dilated and fluid-filled small bowel loops leading to a transition point in the right lower quadrant where there is twisting of small bowel around clips for appendectomy. Vascular/Lymphatic: No acute vascular abnormality. No mass or adenopathy. Reproductive:Located IUD. Other: No ascites or pneumoperitoneum. Musculoskeletal: No acute abnormalities. IMPRESSION: High-grade small bowel obstruction with transition point near the patient's prior appendectomy site. Hepatic steatosis. Electronically Signed   By: Tiburcio Pea M.D.   On: 11/30/2021 05:11    Anti-infectives: Anti-infectives (From admission, onward)    None        Assessment/Plan  SBO -resolved -DC NG -CLD, ADAT to soft, but may DC home if tolerates CLD from our standpoint.  Discussed diet advancement at home with patient.   -d/w primary service as well   FEN - CLD, ADAT to soft VTE - Lovenox ID - none  I reviewed hospitalist notes, last 24 h vitals and pain scores, last 48 h intake and output, last 24 h labs and trends, and last 24 h imaging results.   LOS: 1 day    Letha Cape , [facility] Surgery 12/01/2021, 9:08 AM Please see Amion for pager number during day hours 7:00am-4:30pm or 7:00am -11:30am on weekends

## 2021-12-01 NOTE — Discharge Summary (Signed)
Physician Discharge Summary  [name] WJX:[id number] DOB: [birth year] DOA: 11/30/2021  PCP: Mila Palmer, MD  Admit date: 11/30/2021 Discharge date: 12/01/2021  Time spent: 60 minutes  Recommendations for Outpatient Follow-up:  Follow-up with Mila Palmer, MD in 2 weeks.  On follow-up patient will need a comprehensive metabolic profile, magnesium level, CBC done to follow-up on electrolytes, renal function, hemoglobin, LFTs.   Discharge Diagnoses:  Principal Problem:   SBO (small bowel obstruction) (HCC) Active Problems:   History of appendectomy   Metabolic acidosis, increased anion gap   Hyponatremia   Hypercalcemia   Polycythemia   Elevated liver enzymes   Anxiety and depression   Discharge Condition: Stable and improved  Diet recommendation: Clear liquids and advance as tolerated over the next 24 to 48 hours to a soft diet.  There were no vitals filed for this visit.  History of present illness:  HPI per Dr. Katrinka Blazing Kristine Graves is a 36 y.o. female with medical history significant of anxiety, depression, and prior laparoscopic appendectomy in 2021 who presents with complaints of abdominal pain which started 2 days ago.  Pain was in her right quadrant of her abdomen and described as achy and severe.  Pain worsened with palpation of her stomach.  Associated symptoms included abdominal bloating and frequent belching.  She was not able to eat or drink much of anything.Marland Kitchen  Her last bowel movement was approximately 2 days ago and she has not been able to pass gas recently to her knowledge.  This morning around 1:30 she developed nausea and vomiting.   In the emergency department patient was noted to have stable vital signs.  Labs significant for hemoglobin 15.6,'s odium 132, CO2 21, calcium gap 19, AST 55, ALT 47, total bilirubin 2.2, and lipase 23.  Urinalysis noted elevated specific gravity with rare bacteria, and 11-20 WBCs.  CT scan of the abdomen pelvis revealed  high-grade small bowel obstruction with transition point near patient's prior appendectomy site.  General surgery has been consulted.  Orders placed to place NG tube.  Patient had been given 1 L of lactated Ringer's, antiemetics, and Dilaudid IV while in the ED.  Hospital Course:  #1 small bowel obstruction secondary to adhesions from appendectomy -Patient presented with sudden onset abdominal pain, nausea vomiting. -Patient with prior history of laparoscopic appendectomy 2021. -CT abdomen and pelvis done noted high-grade obstruction in the area of previous appendectomy. -NG tube placed with clinical improvement. -Patient was placed on bowel rest, IV fluids, pain management, IV PPI, IV antiemetics. -General surgery was consulted who recommended SBO protocol and monitoring. -Patient improved clinically, had no further nausea and vomiting, seen by general surgery PA on 12/01/2020 and noted to be passing flatus and diarrhea with still mild abdominal tenderness as expected. -Small bowel obstruction protocol noted that the colon was contrast opacified to the rectum, small bowel is gas-filled although nondistended. -Patient started on clear liquid diet which she tolerated and patient cleared by general surgery for discharge. -Outpatient follow-up with PCP.  2.  Metabolic acidosis with increased anion gap -Most likely secondary to lactic acidosis secondary to dehydration. -Patient hydrated with IV fluids with resolution of metabolic acidosis.  3.  Hyponatremia/hypercalcemia/polycythemia -Felt secondary to hypovolemia/dehydration from nausea and vomiting. -Patient hydrated with IV fluids with resolution of hyponatremia, hypercalcemia and polycythemia by day of discharge.  4.  Transaminitis -Felt likely reactive in nature, improved with hydration.  5.  Depression/anxiety -Home regimen of Lexapro, Wellbutrin and hydroxyzine held during the hospitalization patient  placed on as needed Ativan for  anxiety. -Home regimen will be resumed on discharge.  Procedures: CT abdomen and pelvis 11/30/2021 Small bowel obstruction protocol   Consultations: General surgery: Dr.Tsuei 11/30/2021  Discharge Exam: Vitals:   12/01/21 1215 12/01/21 1230  BP:  123/82  Pulse: 82 66  Resp: 18 15  Temp:    SpO2: 95% 98%    General: NAD Cardiovascular: Regular rate rhythm no murmurs rubs or gallops.  No JVD.  No lower extremity edema. Respiratory: Clear to auscultation bilaterally.  No wheezes, no crackles, no rhonchi.  Fair air movement.  Discharge Instructions   Discharge Instructions     Diet clear liquid   Complete by: As directed    Advance as tolerated to a full liquid diet, then subsequently to a soft diet in the next 24 to 48 hours.   Increase activity slowly   Complete by: As directed    Increase activity slowly   Complete by: As directed       Allergies as of 12/01/2021   No Known Allergies      Medication List     TAKE these medications    acetaminophen 500 MG tablet Commonly known as: TYLENOL You can take 1000 mg of Tylenol/acetaminophen every 8 hours as needed for pain.  For the next 24-48hrs we recommend you just take it on a schedule.  As pain improves you can go back to using it as needed.  You can buy this over-the-counter at any drugstore.  Do not exceed 4000 mg of Tylenol per day it can harm your liver.   buPROPion 150 MG 24 hr tablet Commonly known as: WELLBUTRIN XL Take 150 mg by mouth every morning.   cholecalciferol 25 MCG (1000 UNIT) tablet Commonly known as: VITAMIN D3 Take 1,000 Units by mouth daily.   escitalopram 10 MG tablet Commonly known as: LEXAPRO Take 10 mg by mouth daily.   hydrOXYzine 10 MG tablet Commonly known as: ATARAX Take 10 mg by mouth daily as needed for anxiety.   ibuprofen 200 MG tablet Commonly known as: ADVIL You can take 2 to 3 tablets every 6 hours as needed for pain.  This is your second pain control medication.   You can alternate this with plain Tylenol/acetaminophen.  You can buy this over-the-counter at any drugstore.   multivitamin with minerals tablet Take 1 tablet by mouth daily.       No Known Allergies  Follow-up Information     Mila Palmer, MD. Schedule an appointment as soon as possible for a visit in 2 week(s).   Specialty: Family Medicine Contact information: [address] [place] [address] [phone]                  The results of significant diagnostics from this hospitalization (including imaging, microbiology, ancillary and laboratory) are listed below for reference.    Significant Diagnostic Studies: DG Abd Portable 1V-Small Bowel Obstruction Protocol-initial, 8 hr delay  Result Date: 11/30/2021 CLINICAL DATA:  Small-bowel obstruction, 8 hour film EXAM: PORTABLE ABDOMEN - 1 VIEW COMPARISON:  11/30/2021, 9:29 a.m. FINDINGS: The colon is contrast opacified to the rectum. The small bowel is gas-filled although nondistended. Esophagogastric tube with tip and side port below the diaphragm. Excreted contrast in the urinary bladder. IMPRESSION: The colon is contrast opacified to the rectum. The small bowel is gas-filled although nondistended. Electronically Signed   By: Jearld Lesch M.D.   On: 11/30/2021 19:44   DG Abd  Portable 1V  Result Date: 11/30/2021 CLINICAL DATA:  NG tube placement, small-bowel obstruction. EXAM: PORTABLE ABDOMEN - 1 VIEW COMPARISON:  CT 11/30/2021 FINDINGS: Nasogastric tube is present as it is looped once within the stomach as tip is over the left mid abdomen. Air is present throughout the colon. No definite dilated small bowel loops visualized. No free peritoneal air. Surgical clips over the right lower quadrant. IUD is present in adequate position. Moderate contrast over the bladder from recent CT scan. No focal bony abnormality. IMPRESSION: Nonobstructive bowel gas pattern. Nasogastric tube looped once within the  stomach with tip over the left mid abdomen. Electronically Signed   By: Elberta Fortis M.D.   On: 11/30/2021 09:41   CT ABDOMEN PELVIS W CONTRAST  Result Date: 11/30/2021 CLINICAL DATA:  Left lower quadrant pain with bloating EXAM: CT ABDOMEN AND PELVIS WITH CONTRAST TECHNIQUE: Multidetector CT imaging of the abdomen and pelvis was performed using the standard protocol following bolus administration of intravenous contrast. RADIATION DOSE REDUCTION: This exam was performed according to the departmental dose-optimization program which includes automated exposure control, adjustment of the mA and/or kV according to patient size and/or use of iterative reconstruction technique. CONTRAST:  OMNIPAQUE IOHEXOL 300 MG/ML  SOLN COMPARISON:  06/09/2019 FINDINGS: Lower chest:  No contributory findings. Hepatobiliary: Hepatic steatosis.No evidence of biliary obstruction or stone. Pancreas: Unremarkable. Spleen: Unremarkable. Adrenals/Urinary Tract: Negative adrenals. No hydronephrosis or stone. Unremarkable bladder. Stomach/Bowel: Dilated and fluid-filled small bowel loops leading to a transition point in the right lower quadrant where there is twisting of small bowel around clips for appendectomy. Vascular/Lymphatic: No acute vascular abnormality. No mass or adenopathy. Reproductive:Located IUD. Other: No ascites or pneumoperitoneum. Musculoskeletal: No acute abnormalities. IMPRESSION: High-grade small bowel obstruction with transition point near the patient's prior appendectomy site. Hepatic steatosis. Electronically Signed   By: Tiburcio Pea M.D.   On: 11/30/2021 05:11    Microbiology: No results found for this or any previous visit (from the past 240 hour(s)).   Labs: Basic Metabolic Panel: Recent Labs  Lab 11/30/21 0227 12/01/21 0531  NA 132* 141  K 4.3 3.8  CL 92* 102  CO2 21* 25  GLUCOSE 76 70  BUN 12 13  CREATININE 0.90 0.75  CALCIUM 11.1* 9.4   Liver Function Tests: Recent Labs  Lab  11/30/21 0227 12/01/21 0531  AST 55* 27  ALT 47* 30  ALKPHOS 62 48  BILITOT 2.2* 1.4*  PROT 9.0* 6.8  ALBUMIN 5.2* 3.9   Recent Labs  Lab 11/30/21 0227  LIPASE 23   No results for input(s): "AMMONIA" in the last 168 hours. CBC: Recent Labs  Lab 11/30/21 0227 12/01/21 0531  WBC 7.9 3.9*  NEUTROABS 5.7  --   HGB 15.6* 12.5  HCT 46.7* 37.7  MCV 99.6 100.8*  PLT 199 130*   Cardiac Enzymes: No results for input(s): "CKTOTAL", "CKMB", "CKMBINDEX", "TROPONINI" in the last 168 hours. BNP: BNP (last 3 results) No results for input(s): "BNP" in the last 8760 hours.  ProBNP (last 3 results) No results for input(s): "PROBNP" in the last 8760 hours.  CBG: No results for input(s): "GLUCAP" in the last 168 hours.     Signed:  Ramiro Harvest MD.  [facility] 12/01/2021, 2:32 PM

## 2021-12-10 DIAGNOSIS — K56609 Unspecified intestinal obstruction, unspecified as to partial versus complete obstruction: Secondary | ICD-10-CM | POA: Diagnosis not present

## 2021-12-10 DIAGNOSIS — E872 Acidosis, unspecified: Secondary | ICD-10-CM | POA: Diagnosis not present

## 2021-12-10 DIAGNOSIS — D751 Secondary polycythemia: Secondary | ICD-10-CM | POA: Diagnosis not present

## 2021-12-10 DIAGNOSIS — E871 Hypo-osmolality and hyponatremia: Secondary | ICD-10-CM | POA: Diagnosis not present

## 2021-12-17 DIAGNOSIS — R140 Abdominal distension (gaseous): Secondary | ICD-10-CM | POA: Diagnosis not present

## 2021-12-18 ENCOUNTER — Other Ambulatory Visit: Payer: Self-pay | Admitting: Family Medicine

## 2021-12-18 ENCOUNTER — Ambulatory Visit
Admission: RE | Admit: 2021-12-18 | Discharge: 2021-12-18 | Disposition: A | Discharge status: Home / Self Care | Payer: BC Managed Care – PPO | Source: Ambulatory Visit | Attending: Family Medicine | Admitting: Family Medicine

## 2021-12-18 DIAGNOSIS — R140 Abdominal distension (gaseous): Secondary | ICD-10-CM

## 2021-12-18 DIAGNOSIS — I878 Other specified disorders of veins: Secondary | ICD-10-CM | POA: Diagnosis not present

## 2021-12-20 IMAGING — US US ABDOMEN LIMITED
1 series · 14 of 25 positions shown · non-contrast
Comparison: CT abdomen pelvis dated June 09, 2019.

CLINICAL DATA: Elevated LFTs.

EXAM:
ULTRASOUND ABDOMEN LIMITED RIGHT UPPER QUADRANT

[Series 1: us abdomen limited · 0.23mm/px · 14 of 43 slices shown]
[im 1/43]
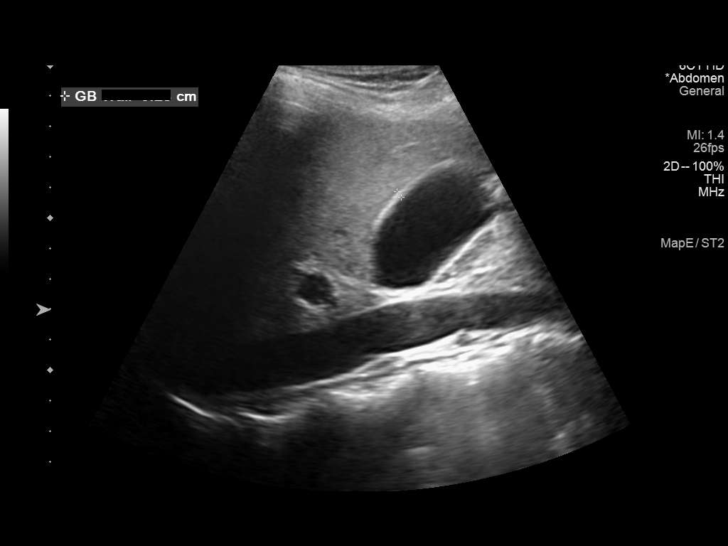
[im 4/43]
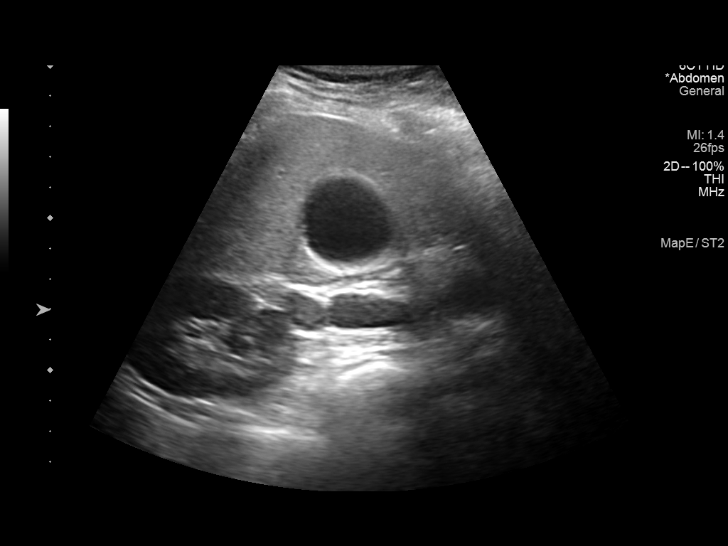
[im 8/43]
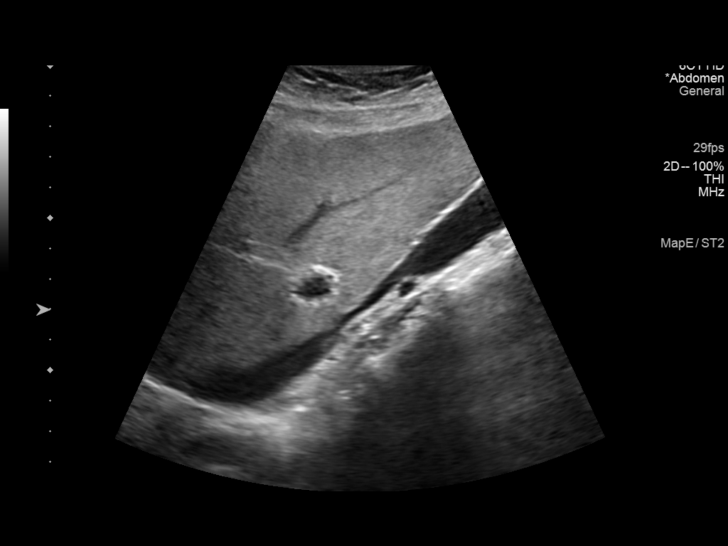
[im 11/43]
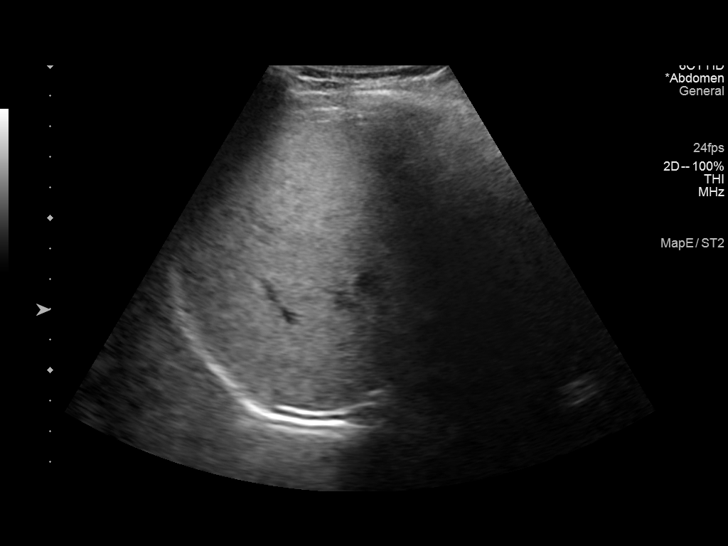
[im 15/43]
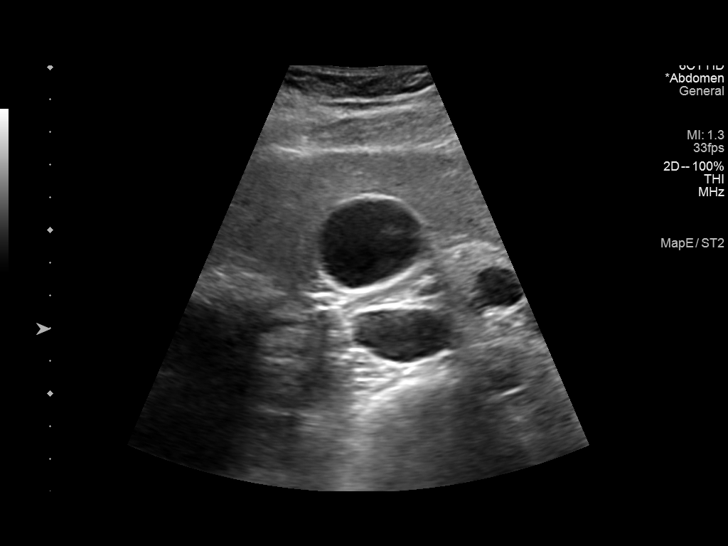
[im 16/43]
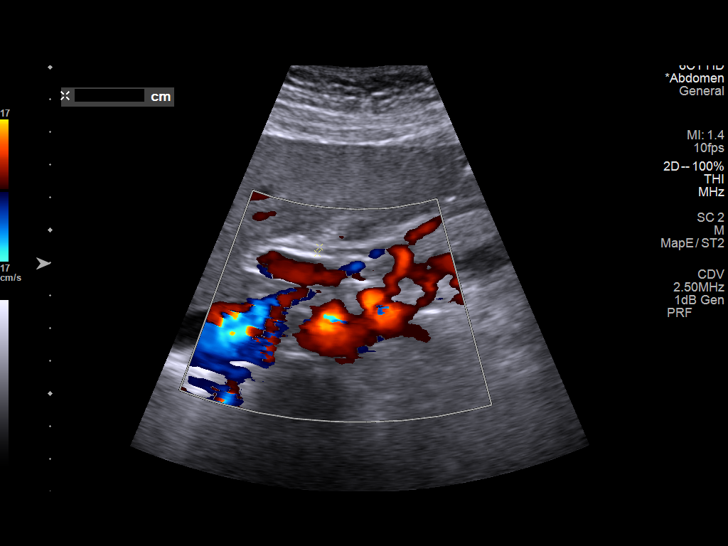
[im 20/43]
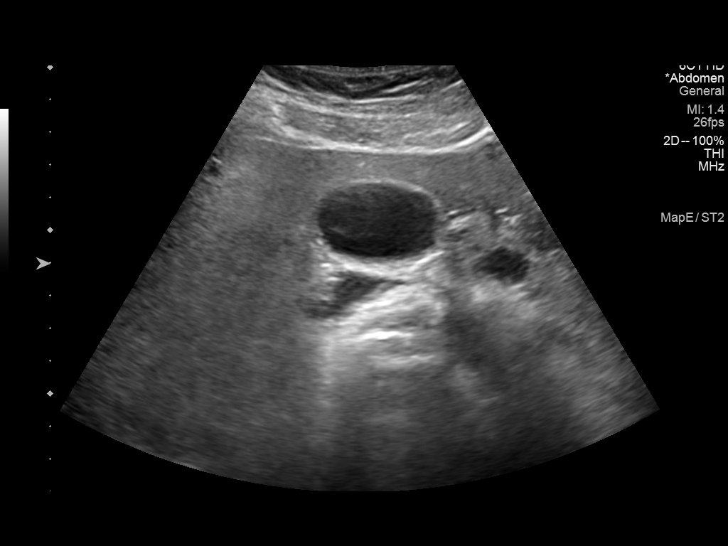
[im 23/43]
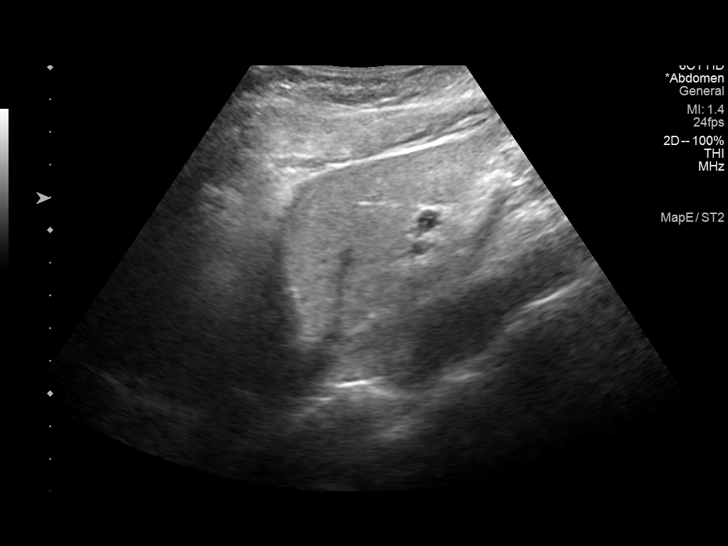
[im 27/43]
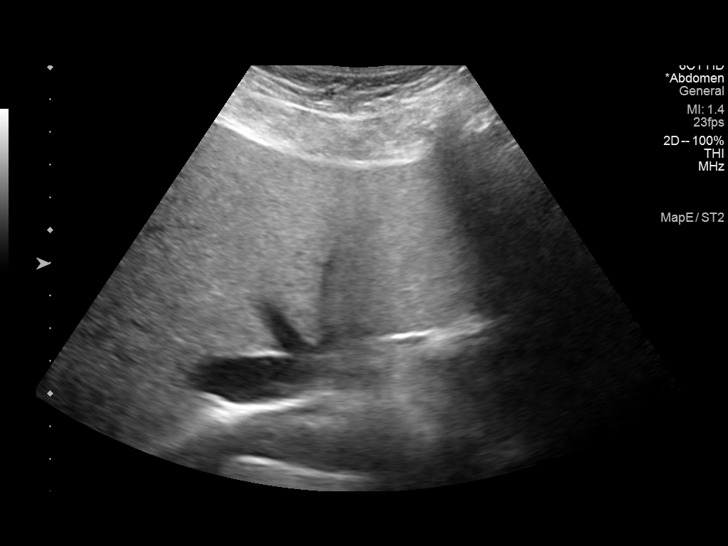
[im 29/43]
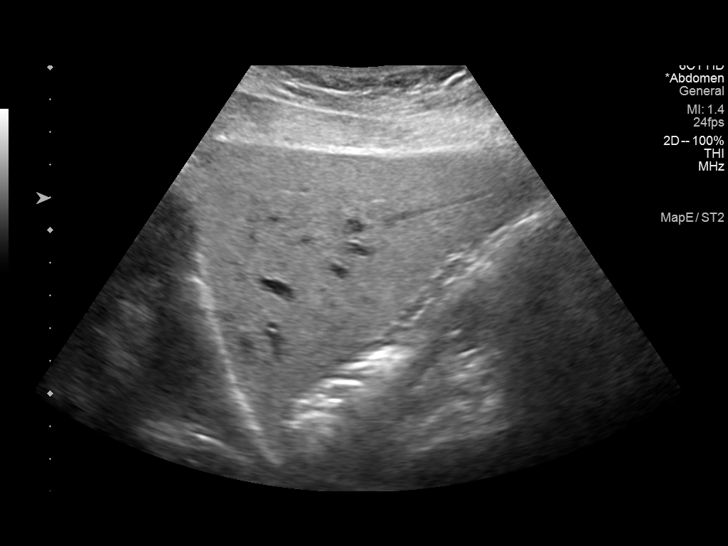
[im 32/43]
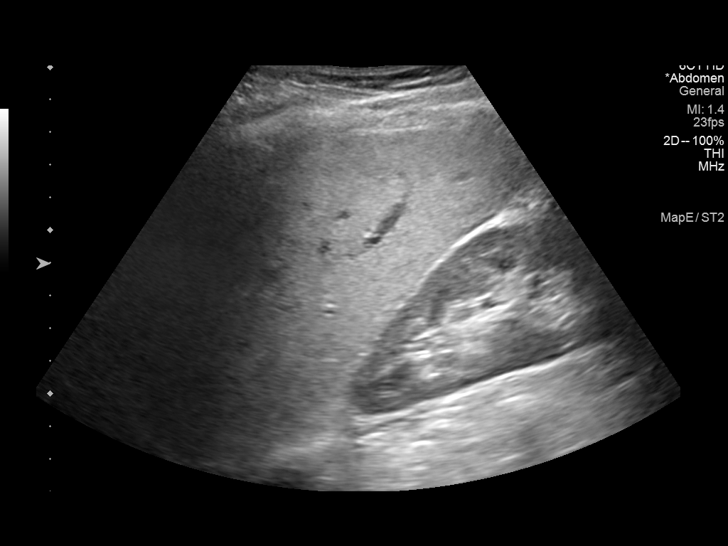
[im 36/43]
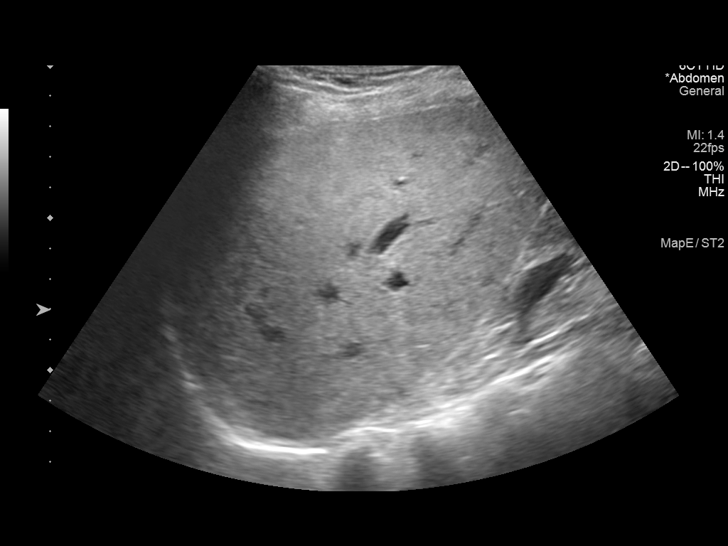
[im 39/43]
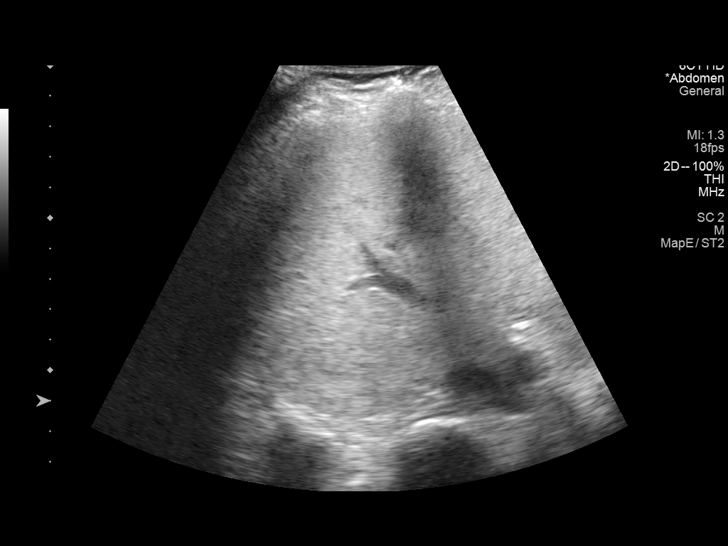
[im 43/43]
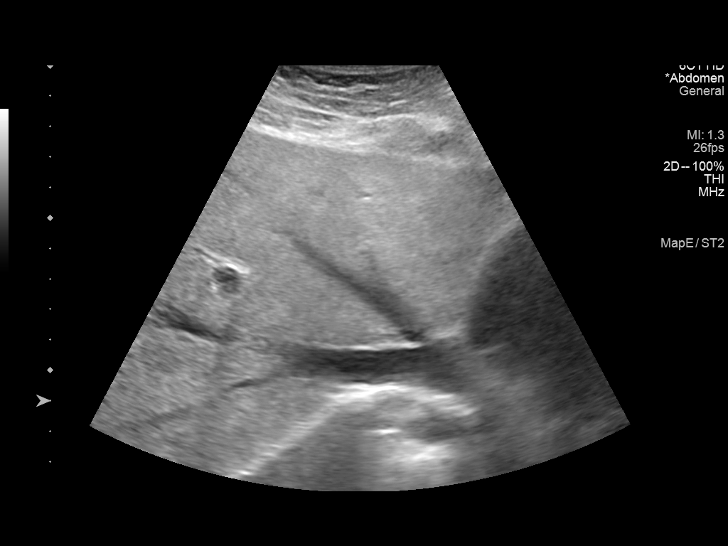

[14 of 25 positions shown; findings below may reference images not displayed]

FINDINGS: Gallbladder:

No gallstones or wall thickening visualized. No sonographic Murphy
sign noted by sonographer.

Common bile duct:

Diameter: 2 mm, normal.

Liver:

No focal lesion identified. Diffusely increased in parenchymal
echogenicity. Portal vein is patent on color Doppler imaging with
normal direction of blood flow towards the liver.

Other: None.
IMPRESSION: 1. Diffusely increased hepatic parenchymal echogenicity,
nonspecific, but suggestive of steatosis.

## 2021-12-24 ENCOUNTER — Other Ambulatory Visit: Payer: Self-pay | Admitting: Family Medicine

## 2021-12-24 DIAGNOSIS — R140 Abdominal distension (gaseous): Secondary | ICD-10-CM

## 2021-12-24 DIAGNOSIS — R7401 Elevation of levels of liver transaminase levels: Secondary | ICD-10-CM

## 2022-01-01 DIAGNOSIS — F411 Generalized anxiety disorder: Secondary | ICD-10-CM | POA: Diagnosis not present

## 2022-01-01 DIAGNOSIS — R748 Abnormal levels of other serum enzymes: Secondary | ICD-10-CM | POA: Diagnosis not present

## 2022-03-03 DIAGNOSIS — L821 Other seborrheic keratosis: Secondary | ICD-10-CM | POA: Diagnosis not present

## 2022-03-03 DIAGNOSIS — Z23 Encounter for immunization: Secondary | ICD-10-CM | POA: Diagnosis not present

## 2022-03-03 DIAGNOSIS — L814 Other melanin hyperpigmentation: Secondary | ICD-10-CM | POA: Diagnosis not present

## 2022-03-03 DIAGNOSIS — L710 Perioral dermatitis: Secondary | ICD-10-CM | POA: Diagnosis not present

## 2022-03-03 DIAGNOSIS — D225 Melanocytic nevi of trunk: Secondary | ICD-10-CM | POA: Diagnosis not present

## 2022-05-23 DIAGNOSIS — F331 Major depressive disorder, recurrent, moderate: Secondary | ICD-10-CM | POA: Diagnosis not present

## 2022-05-23 DIAGNOSIS — F411 Generalized anxiety disorder: Secondary | ICD-10-CM | POA: Diagnosis not present

## 2022-08-01 DIAGNOSIS — F331 Major depressive disorder, recurrent, moderate: Secondary | ICD-10-CM | POA: Diagnosis not present

## 2022-08-01 DIAGNOSIS — F411 Generalized anxiety disorder: Secondary | ICD-10-CM | POA: Diagnosis not present

## 2022-10-01 DIAGNOSIS — F331 Major depressive disorder, recurrent, moderate: Secondary | ICD-10-CM | POA: Diagnosis not present

## 2022-10-01 DIAGNOSIS — F411 Generalized anxiety disorder: Secondary | ICD-10-CM | POA: Diagnosis not present

## 2022-10-16 DIAGNOSIS — Z6822 Body mass index (BMI) 22.0-22.9, adult: Secondary | ICD-10-CM | POA: Diagnosis not present

## 2022-10-16 DIAGNOSIS — Z124 Encounter for screening for malignant neoplasm of cervix: Secondary | ICD-10-CM | POA: Diagnosis not present

## 2022-10-16 DIAGNOSIS — Z01419 Encounter for gynecological examination (general) (routine) without abnormal findings: Secondary | ICD-10-CM | POA: Diagnosis not present

## 2022-10-16 DIAGNOSIS — Z1151 Encounter for screening for human papillomavirus (HPV): Secondary | ICD-10-CM | POA: Diagnosis not present

## 2022-12-15 DIAGNOSIS — J069 Acute upper respiratory infection, unspecified: Secondary | ICD-10-CM | POA: Diagnosis not present

## 2022-12-15 DIAGNOSIS — R051 Acute cough: Secondary | ICD-10-CM | POA: Diagnosis not present

## 2022-12-15 DIAGNOSIS — Z03818 Encounter for observation for suspected exposure to other biological agents ruled out: Secondary | ICD-10-CM | POA: Diagnosis not present

## 2023-01-13 DIAGNOSIS — Z23 Encounter for immunization: Secondary | ICD-10-CM | POA: Diagnosis not present

## 2023-01-13 DIAGNOSIS — E538 Deficiency of other specified B group vitamins: Secondary | ICD-10-CM | POA: Diagnosis not present

## 2023-01-13 DIAGNOSIS — Z79899 Other long term (current) drug therapy: Secondary | ICD-10-CM | POA: Diagnosis not present

## 2023-01-13 DIAGNOSIS — R748 Abnormal levels of other serum enzymes: Secondary | ICD-10-CM | POA: Diagnosis not present

## 2023-01-27 DIAGNOSIS — E538 Deficiency of other specified B group vitamins: Secondary | ICD-10-CM | POA: Diagnosis not present

## 2023-01-27 DIAGNOSIS — Z79899 Other long term (current) drug therapy: Secondary | ICD-10-CM | POA: Diagnosis not present

## 2023-01-27 DIAGNOSIS — R748 Abnormal levels of other serum enzymes: Secondary | ICD-10-CM | POA: Diagnosis not present

## 2023-02-25 DIAGNOSIS — D2272 Melanocytic nevi of left lower limb, including hip: Secondary | ICD-10-CM | POA: Diagnosis not present

## 2023-02-25 DIAGNOSIS — L821 Other seborrheic keratosis: Secondary | ICD-10-CM | POA: Diagnosis not present

## 2023-02-25 DIAGNOSIS — D225 Melanocytic nevi of trunk: Secondary | ICD-10-CM | POA: Diagnosis not present

## 2023-02-25 DIAGNOSIS — L814 Other melanin hyperpigmentation: Secondary | ICD-10-CM | POA: Diagnosis not present

## 2023-04-20 DIAGNOSIS — F1020 Alcohol dependence, uncomplicated: Secondary | ICD-10-CM | POA: Diagnosis not present

## 2023-04-20 DIAGNOSIS — Z8616 Personal history of COVID-19: Secondary | ICD-10-CM | POA: Diagnosis not present

## 2023-04-20 DIAGNOSIS — G4700 Insomnia, unspecified: Secondary | ICD-10-CM | POA: Diagnosis not present

## 2023-04-20 DIAGNOSIS — R7401 Elevation of levels of liver transaminase levels: Secondary | ICD-10-CM | POA: Diagnosis not present

## 2023-04-20 DIAGNOSIS — J3089 Other allergic rhinitis: Secondary | ICD-10-CM | POA: Diagnosis not present

## 2023-04-20 DIAGNOSIS — F419 Anxiety disorder, unspecified: Secondary | ICD-10-CM | POA: Diagnosis not present

## 2023-04-20 DIAGNOSIS — F329 Major depressive disorder, single episode, unspecified: Secondary | ICD-10-CM | POA: Diagnosis not present

## 2023-04-25 DIAGNOSIS — F1020 Alcohol dependence, uncomplicated: Secondary | ICD-10-CM | POA: Diagnosis not present

## 2023-05-13 DIAGNOSIS — F1020 Alcohol dependence, uncomplicated: Secondary | ICD-10-CM | POA: Diagnosis not present

## 2023-05-21 DIAGNOSIS — F1020 Alcohol dependence, uncomplicated: Secondary | ICD-10-CM | POA: Diagnosis not present

## 2023-05-22 DIAGNOSIS — F1020 Alcohol dependence, uncomplicated: Secondary | ICD-10-CM | POA: Diagnosis not present

## 2023-05-25 DIAGNOSIS — F1020 Alcohol dependence, uncomplicated: Secondary | ICD-10-CM | POA: Diagnosis not present

## 2023-05-26 DIAGNOSIS — F1020 Alcohol dependence, uncomplicated: Secondary | ICD-10-CM | POA: Diagnosis not present

## 2023-05-27 DIAGNOSIS — F1020 Alcohol dependence, uncomplicated: Secondary | ICD-10-CM | POA: Diagnosis not present

## 2023-05-28 DIAGNOSIS — F1020 Alcohol dependence, uncomplicated: Secondary | ICD-10-CM | POA: Diagnosis not present

## 2023-05-29 DIAGNOSIS — F1020 Alcohol dependence, uncomplicated: Secondary | ICD-10-CM | POA: Diagnosis not present

## 2023-06-01 DIAGNOSIS — F1020 Alcohol dependence, uncomplicated: Secondary | ICD-10-CM | POA: Diagnosis not present

## 2023-06-02 DIAGNOSIS — F1020 Alcohol dependence, uncomplicated: Secondary | ICD-10-CM | POA: Diagnosis not present

## 2023-06-03 DIAGNOSIS — F1020 Alcohol dependence, uncomplicated: Secondary | ICD-10-CM | POA: Diagnosis not present

## 2023-06-04 DIAGNOSIS — F1020 Alcohol dependence, uncomplicated: Secondary | ICD-10-CM | POA: Diagnosis not present

## 2023-06-05 DIAGNOSIS — F1020 Alcohol dependence, uncomplicated: Secondary | ICD-10-CM | POA: Diagnosis not present

## 2023-06-08 DIAGNOSIS — F1020 Alcohol dependence, uncomplicated: Secondary | ICD-10-CM | POA: Diagnosis not present

## 2023-06-09 DIAGNOSIS — F1020 Alcohol dependence, uncomplicated: Secondary | ICD-10-CM | POA: Diagnosis not present

## 2023-06-10 DIAGNOSIS — F1020 Alcohol dependence, uncomplicated: Secondary | ICD-10-CM | POA: Diagnosis not present

## 2023-06-11 DIAGNOSIS — F1020 Alcohol dependence, uncomplicated: Secondary | ICD-10-CM | POA: Diagnosis not present

## 2023-06-12 DIAGNOSIS — F1020 Alcohol dependence, uncomplicated: Secondary | ICD-10-CM | POA: Diagnosis not present

## 2023-06-15 DIAGNOSIS — F1020 Alcohol dependence, uncomplicated: Secondary | ICD-10-CM | POA: Diagnosis not present

## 2023-06-16 DIAGNOSIS — F1020 Alcohol dependence, uncomplicated: Secondary | ICD-10-CM | POA: Diagnosis not present

## 2023-06-17 DIAGNOSIS — F1020 Alcohol dependence, uncomplicated: Secondary | ICD-10-CM | POA: Diagnosis not present

## 2023-06-28 NOTE — Progress Notes (Unsigned)
 Psychiatric Initial Adult Assessment  Patient Identification: Kristine Graves MRN:  [id number] Date of Evaluation:  06/28/2023 Referral Source: Elray Buba, MD  Assessment:  Kristine Graves is a 38 y.o. female with a history of generalized anxiety disorder, alcohol use disorder who presents in person to [facility] to establish care with outpatient psychiatry.  Patient recently completed residential rehab followed by partial hospitalization program through [facility] to address alcohol use disorder. She had been discharged on naltrexone 50 mg, trazodone 50 mg, duloxetine 60 mg, and buspirone 10 mg tid. Overall, she feels that her medication regiment allows for her anxiety and mood to be more manageable and she feels ready to return to work tomorrow. She denies alcohol craving and feels she has a strong social support through family, AA, therapist, and sponsor. She sees her therapist weekly who is a substance use counselor. Plan for her to continue psychotropic regiment at this time and follow up in one month to identify whether additional stress of work requires further titration of medications or primarily can be addressed through non-pharmacological interventions. Based on assessment and history, she met criteria for alcohol use disorder, major depressive disorder, generalized anxiety disorder, and alcohol induced mood disorder.   Risk Assessment: A suicide and violence risk assessment was performed as part of this evaluation. Patient is deemed to be at chronic elevated risk for self-harm/suicide given the following factors: suicidal ideation or threats without a plan, feelings of hopelessness, sense of isolation, and history of depression. These risk factors are mitigated by the following factors: no known access to weapons or firearms, no history of previous suicide attempts, no history of violence, motivation for treatment, utilization of positive coping skills,  supportive family, sense of responsibility to family and social supports, minor children living at home, presence of a significant relationship, presence of an available support system, expresses purpose for living, current treatment compliance, effective problem solving skills, safe housing, support system in agreement with treatment recommendations, and presence of a safety plan with follow-up care. The patient is deemed to be at chronic elevated risk for violence given the following factors: N/A. These risk factors are mitigated by the following factors: no known history of violence towards others and high intellectual functioning. There is no acute risk for suicide or violence at this time. The patient was educated about relevant modifiable risk factors including following recommendations for treatment of psychiatric illness and abstaining from substance abuse.  While future psychiatric events cannot be accurately predicted, the patient does not currently require  acute inpatient psychiatric care and does not currently meet [facility] involuntary commitment criteria.    Plan:  # Alcohol Use Disorder, in early remission Past medication trials:  Interventions: -- Last Use: December 2024 -- Continue abstaining from alcohol -- continue naltrexone 50 mg at bedtime  -LFTs wnl (will need [facility] record), denies recent opiate use  -Can be transitioned to IM injection if prefer this modality of treatment  # Major Depressive Disorder, in remission # History of Postpartum Depression # History of alcohol induced mood disorder # Generalized Anxiety Disorder Past medication trials:  Status of problem: remission Interventions: -- continue duloxetine 60 mg daily  -- continue buspirone 10 mg tid (can be consolidated to bid dosing)  # MTHFR gene variant Past medication trials:  Interventions: -- Continue deplin 15 mg daily  Return to care in 4 weeks  Patient was given contact information  for behavioral health clinic and was instructed to call  [phone] for emergencies.    Patient and plan of care will be discussed with the Attending MD, Dr. Mercy Riding, who agrees with the above statement and plan.   Subjective:  Chief Complaint: Medication Management  History of Present Illness:   Patient reports mental health problems started in her 4s when she experienced a major depressive episode following multiple psychosocial stressors including bad relationship and work difficulties. She endorsed multiple symptoms of depression resulting in her requiring psychotropics at that time. These were eventually stopped and she participated in therapy which she had found beneficial. She struggled with significant depression again around the time of COVID-19 pandemic which was also around the time she had her daughter which she suspect may have been also concerning for untreated postpartum depression. She would experience fleeting suicidal thoughts that she never acted on but were very distressing. Her alcohol use began to become significantly more frequent and prominent as she began to self-medicate with alcohol in order to address her anxiety and insomnia. She noticed her depressive symptoms and lability significantly worsened in the context of heavy sustained alcohol use. She reports eventually opting to go to [facility] for residential treatment late last year due to feeling the need to seek assistance to manage her alcohol use disorder as well as improve her mood. She attended PHP after attending residential rehab in order to ensure adequate coping skills and assess capacity to handle stress outside of residential treatment. She reports overall doing well at this time and plans to return to work 06/30/23. She feels neither excited nor anxious about returning to work but feels it is the right time for it. She denies current alcohol cravings nor mood instability. She feels she has become more reliable and  even-keel. She denies current somatic complaints from her regiment of duloxetine, buspirone, trazodone, deplin, and naltrexone. She is worried about reliance on trazodone given she had previously used alcohol to address her insomnia. She reports eating appropriately.  She endorses history of generalized anxiety that has fluctuated over several years. She feels this preceded any form of alcohol dependence. She denies history of mania or psychosis.  She denies use of any illicit substances. She currently lives with 2 daughters and husband.   Past Psychiatric History:  Diagnoses: MDD, GAD, alcohol use disorder Medication trials: auvelity (ineffective), lorazepam (sedating), lexapro, mirtazapine, bupropion Previous psychiatrist/therapist: Dr. Adrian Prows Hospitalizations: denies Suicide attempts: denies SIB: denies Hx of violence towards others: denies Current access to guns: denies  Substance Abuse History in the last 12 months:  Yes.  Attended [facility] residential rehab in December 2024 and [facility] January 2025.  Past Medical History:  Past Medical History:  Diagnosis Date   Anxiety    Blood transfusion without reported diagnosis    at birth RH incompatibility   Depression     Past Surgical History:  Procedure Laterality Date   BREAST ENHANCEMENT SURGERY     LAPAROSCOPIC APPENDECTOMY N/A 06/09/2019   Procedure: APPENDECTOMY LAPAROSCOPIC;  Surgeon: Gaynelle Adu, MD;  Location: [facility] OR;  Service: General;  Laterality: N/A;   NASAL SINUS SURGERY       Family History:  Family History  Problem Relation Age of Onset   Hypertension Father    Graves' disease Maternal Grandmother    Diabetes Maternal Grandfather    Heart attack Paternal Grandfather    Diabetes Paternal Grandfather     Social History:   Academic/Vocational: full time employment Social History   Socioeconomic History   Marital status: Married  Spouse name: Not on file   Number of children: Not on file    Years of education: Not on file   Highest education level: Not on file  Occupational History   Not on file  Tobacco Use   Smoking status: Never   Smokeless tobacco: Never  Vaping Use   Vaping status: Never Used  Substance and Sexual Activity   Alcohol use: Not Currently   Drug use: Never   Sexual activity: Not on file  Other Topics Concern   Not on file  Social History Narrative   Not on file   Social Drivers of Health   Financial Resource Strain: Low Risk  (07/12/2018)   Overall Financial Resource Strain (CARDIA)    Difficulty of Paying Living Expenses: Not hard at all  Food Insecurity: No Food Insecurity (07/12/2018)   Hunger Vital Sign    Worried About Running Out of Food in the Last Year: Never true    Ran Out of Food in the Last Year: Never true  Transportation Needs: Unknown (07/12/2018)   PRAPARE - Administrator, Civil Service (Medical): No    Lack of Transportation (Non-Medical): Not on file  Physical Activity: Not on file  Stress: No Stress Concern Present (07/12/2018)   [employer] of Occupational Health - Occupational Stress Questionnaire    Feeling of Stress : Only a little  Social Connections: Not on file    Additional Social History: updated  Allergies:  No Known Allergies  Current Medications: Current Outpatient Medications  Medication Sig Dispense Refill   acetaminophen (TYLENOL) 500 MG tablet You can take 1000 mg of Tylenol/acetaminophen every 8 hours as needed for pain.  For the next 24-48hrs we recommend you just take it on a schedule.  As pain improves you can go back to using it as needed.  You can buy this over-the-counter at any drugstore.  Do not exceed 4000 mg of Tylenol per day it can harm your liver. 30 tablet 0   buPROPion (WELLBUTRIN XL) 150 MG 24 hr tablet Take 150 mg by mouth every morning.     cholecalciferol (VITAMIN D3) 25 MCG (1000 UNIT) tablet Take 1,000 Units by mouth daily.     escitalopram (LEXAPRO) 10 MG tablet Take 10  mg by mouth daily.     hydrOXYzine (ATARAX) 10 MG tablet Take 10 mg by mouth daily as needed for anxiety.     ibuprofen (ADVIL) 200 MG tablet You can take 2 to 3 tablets every 6 hours as needed for pain.  This is your second pain control medication.  You can alternate this with plain Tylenol/acetaminophen.  You can buy this over-the-counter at any drugstore.     Multiple Vitamins-Minerals (MULTIVITAMIN WITH MINERALS) tablet Take 1 tablet by mouth daily.     No current facility-administered medications for this visit.    ROS: Review of Systems   Objective:  Psychiatric Specialty Exam:  unknown if currently breastfeeding.There is no height or weight on file to calculate BMI.  General Appearance: Well Groomed  Eye Contact:  Good  Speech:  Clear and Coherent and Normal Rate  Volume:  Normal  Mood:  Euthymic  Affect:  Appropriate and Congruent  Thought Content: Logical   Suicidal Thoughts:  No  Homicidal Thoughts:  No  Thought Process:  Coherent, Goal Directed, and Linear  Orientation:  Full (Time, Place, and Person)  Judgment:  Intact  Insight:  Good  Concentration:  Concentration: Good  Fund of Knowledge: Fair  Language: Fair  Psychomotor Activity:  Normal  Akathisia:  No  AIMS (if indicated): not done  Assets:  Communication Skills Desire for Improvement Financial Resources/Insurance Housing Intimacy Leisure Time Physical Health Resilience Social Support Talents/Skills Transportation Vocational/Educational  ADL's:  Intact  Cognition: WNL      PE: General: well-appearing; no acute distress  Pulm: no increased work of breathing on room air  Strength & Muscle Tone: within normal limits Neuro: no focal neurological deficits observed  Gait & Station: normal   Screenings:  PHQ2-9    Flowsheet Row [place] [facility] OB FOLLOW UP from 11/02/2015 in [facility]  PHQ-2 Total Score 0      Flowsheet Row ED from  11/30/2021 in [facility] Emergency Department at [facility]  C-SSRS RISK CATEGORY No Risk        Park Pope, MD 2/16/20255:02 PM

## 2023-06-29 ENCOUNTER — Ambulatory Visit (HOSPITAL_BASED_OUTPATIENT_CLINIC_OR_DEPARTMENT_OTHER): Payer: Self-pay | Admitting: Student

## 2023-06-29 ENCOUNTER — Encounter (HOSPITAL_COMMUNITY): Payer: Self-pay | Admitting: Student

## 2023-06-29 DIAGNOSIS — F3340 Major depressive disorder, recurrent, in remission, unspecified: Secondary | ICD-10-CM | POA: Diagnosis not present

## 2023-06-29 DIAGNOSIS — F411 Generalized anxiety disorder: Secondary | ICD-10-CM | POA: Diagnosis not present

## 2023-06-29 DIAGNOSIS — F1021 Alcohol dependence, in remission: Secondary | ICD-10-CM | POA: Diagnosis not present

## 2023-06-29 MED ORDER — BUSPIRONE HCL 10 MG PO TABS: 10.0000 mg | ORAL_TABLET | Freq: Three times a day (TID) | ORAL | 0 refills | Status: DC

## 2023-06-29 MED ORDER — L-METHYLFOLATE 15 MG PO TABS: 15.0000 mg | ORAL_TABLET | Freq: Every day | ORAL | 0 refills | Status: DC

## 2023-06-29 MED ORDER — NALTREXONE HCL 50 MG PO TABS: 50.0000 mg | ORAL_TABLET | Freq: Every day | ORAL | 0 refills | Status: DC

## 2023-06-29 MED ORDER — DULOXETINE HCL 60 MG PO CPEP: 60.0000 mg | ORAL_CAPSULE | Freq: Every day | ORAL | 0 refills | Status: DC

## 2023-06-29 MED ORDER — TRAZODONE HCL 50 MG PO TABS: 50.0000 mg | ORAL_TABLET | Freq: Every evening | ORAL | 0 refills | Status: DC | PRN

## 2023-07-01 NOTE — Addendum Note (Signed)
 Addended by: Everlena Cooper on: 07/01/2023 11:46 AM   Modules accepted: Level of Service

## 2023-07-21 DIAGNOSIS — R1011 Right upper quadrant pain: Secondary | ICD-10-CM | POA: Diagnosis not present

## 2023-07-22 ENCOUNTER — Other Ambulatory Visit: Payer: Self-pay | Admitting: *Deleted

## 2023-07-22 DIAGNOSIS — R1011 Right upper quadrant pain: Secondary | ICD-10-CM

## 2023-07-24 ENCOUNTER — Ambulatory Visit
Admission: RE | Admit: 2023-07-24 | Discharge: 2023-07-24 | Disposition: A | Discharge status: Home / Self Care | Source: Ambulatory Visit | Attending: *Deleted | Admitting: *Deleted

## 2023-07-24 DIAGNOSIS — R1011 Right upper quadrant pain: Secondary | ICD-10-CM | POA: Diagnosis not present

## 2023-07-24 DIAGNOSIS — N760 Acute vaginitis: Secondary | ICD-10-CM | POA: Diagnosis not present

## 2023-07-24 DIAGNOSIS — N926 Irregular menstruation, unspecified: Secondary | ICD-10-CM | POA: Diagnosis not present

## 2023-07-27 DIAGNOSIS — M9905 Segmental and somatic dysfunction of pelvic region: Secondary | ICD-10-CM | POA: Diagnosis not present

## 2023-07-27 DIAGNOSIS — M5442 Lumbago with sciatica, left side: Secondary | ICD-10-CM | POA: Diagnosis not present

## 2023-07-27 DIAGNOSIS — M7652 Patellar tendinitis, left knee: Secondary | ICD-10-CM | POA: Diagnosis not present

## 2023-07-27 DIAGNOSIS — M9906 Segmental and somatic dysfunction of lower extremity: Secondary | ICD-10-CM | POA: Diagnosis not present

## 2023-07-27 DIAGNOSIS — M5451 Vertebrogenic low back pain: Secondary | ICD-10-CM | POA: Diagnosis not present

## 2023-07-27 DIAGNOSIS — M9903 Segmental and somatic dysfunction of lumbar region: Secondary | ICD-10-CM | POA: Diagnosis not present

## 2023-07-31 DIAGNOSIS — M9903 Segmental and somatic dysfunction of lumbar region: Secondary | ICD-10-CM | POA: Diagnosis not present

## 2023-07-31 DIAGNOSIS — M5451 Vertebrogenic low back pain: Secondary | ICD-10-CM | POA: Diagnosis not present

## 2023-07-31 DIAGNOSIS — M7652 Patellar tendinitis, left knee: Secondary | ICD-10-CM | POA: Diagnosis not present

## 2023-07-31 DIAGNOSIS — M9906 Segmental and somatic dysfunction of lower extremity: Secondary | ICD-10-CM | POA: Diagnosis not present

## 2023-07-31 DIAGNOSIS — M9905 Segmental and somatic dysfunction of pelvic region: Secondary | ICD-10-CM | POA: Diagnosis not present

## 2023-07-31 DIAGNOSIS — M5442 Lumbago with sciatica, left side: Secondary | ICD-10-CM | POA: Diagnosis not present

## 2023-08-03 ENCOUNTER — Ambulatory Visit (HOSPITAL_BASED_OUTPATIENT_CLINIC_OR_DEPARTMENT_OTHER): Payer: Self-pay | Admitting: Student

## 2023-08-03 DIAGNOSIS — F411 Generalized anxiety disorder: Secondary | ICD-10-CM | POA: Diagnosis not present

## 2023-08-03 DIAGNOSIS — F3340 Major depressive disorder, recurrent, in remission, unspecified: Secondary | ICD-10-CM

## 2023-08-03 DIAGNOSIS — F1021 Alcohol dependence, in remission: Secondary | ICD-10-CM

## 2023-08-03 MED ORDER — BUSPIRONE HCL 15 MG PO TABS: 15.0000 mg | ORAL_TABLET | Freq: Two times a day (BID) | ORAL | 0 refills | Status: DC

## 2023-08-03 MED ORDER — NALTREXONE HCL 50 MG PO TABS: 50.0000 mg | ORAL_TABLET | Freq: Every day | ORAL | 0 refills | Status: DC

## 2023-08-03 MED ORDER — DULOXETINE HCL 60 MG PO CPEP
60.0000 mg | ORAL_CAPSULE | Freq: Every day | ORAL | 0 refills | Status: DC
Start: 2023-08-03 — End: 2023-09-14

## 2023-08-03 MED ORDER — TRAZODONE HCL 50 MG PO TABS: 25.0000 mg | ORAL_TABLET | Freq: Every evening | ORAL | 0 refills | Status: DC | PRN

## 2023-08-03 NOTE — Progress Notes (Unsigned)
 Psychiatric Initial Adult Assessment  Patient Identification: Kristine Graves MRN:  [id number] Date of Evaluation:  08/03/2023 Referral Source: Elray Buba, MD  Assessment:  Kristine Graves is a 38 y.o. female with a history of generalized anxiety disorder, alcohol use disorder who presents in person to [facility] to establish care with outpatient psychiatry.  Patient recently completed residential rehab followed by partial hospitalization program through [facility] to address alcohol use disorder. She had been discharged on naltrexone 50 mg, trazodone 50 mg, duloxetine 60 mg, and buspirone 10 mg tid. Overall, she feels that her medication regiment allows for her anxiety and mood to be more manageable and she feels ready to return to work tomorrow. She denies alcohol craving and feels she has a strong social support through family, AA, therapist, and sponsor. She sees her therapist weekly who is a substance use counselor. Plan for her to continue psychotropic regiment at this time and follow up in one month to identify whether additional stress of work requires further titration of medications or primarily can be addressed through non-pharmacological interventions. Based on assessment and history, she met criteria for alcohol use disorder, major depressive disorder, generalized anxiety disorder, and alcohol induced mood disorder.   Risk Assessment: A suicide and violence risk assessment was performed as part of this evaluation. Patient is deemed to be at chronic elevated risk for self-harm/suicide given the following factors: suicidal ideation or threats without a plan, feelings of hopelessness, sense of isolation, and history of depression. These risk factors are mitigated by the following factors: no known access to weapons or firearms, no history of previous suicide attempts, no history of violence, motivation for treatment, utilization of positive coping skills,  supportive family, sense of responsibility to family and social supports, minor children living at home, presence of a significant relationship, presence of an available support system, expresses purpose for living, current treatment compliance, effective problem solving skills, safe housing, support system in agreement with treatment recommendations, and presence of a safety plan with follow-up care. The patient is deemed to be at chronic elevated risk for violence given the following factors: N/A. These risk factors are mitigated by the following factors: no known history of violence towards others and high intellectual functioning. There is no acute risk for suicide or violence at this time. The patient was educated about relevant modifiable risk factors including following recommendations for treatment of psychiatric illness and abstaining from substance abuse.  While future psychiatric events cannot be accurately predicted, the patient does not currently require  acute inpatient psychiatric care and does not currently meet [facility] involuntary commitment criteria.    Methylfolate Hgb too low  Plan:  # Alcohol Use Disorder, in early remission Past medication trials:  Interventions: -- Last Use: December 2024 -- Continue abstaining from alcohol -- continue naltrexone 50 mg at bedtime  -LFTs wnl (will need [facility] record), denies recent opiate use  -Can be transitioned to IM injection if prefer this modality of treatment  # Major Depressive Disorder, in remission # History of Postpartum Depression # History of alcohol induced mood disorder # Generalized Anxiety Disorder Past medication trials:  Status of problem: remission Interventions: -- continue duloxetine 60 mg daily  -- continue buspirone 10 mg tid (can be consolidated to bid dosing)  # MTHFR gene variant Past medication trials:  Interventions: -- Continue deplin 15 mg daily  Return to care in 4 weeks  Patient  was given contact information for behavioral health clinic  and was instructed to call 911 for emergencies.    Patient and plan of care will be discussed with the Attending MD, Dr. Mercy Riding, who agrees with the above statement and plan.   Subjective:  Chief Complaint: Medication Management  History of Present Illness:   Patient reports mental health problems started in her 64s when she experienced a major depressive episode following multiple psychosocial stressors including bad relationship and work difficulties. She endorsed multiple symptoms of depression resulting in her requiring psychotropics at that time. These were eventually stopped and she participated in therapy which she had found beneficial. She struggled with significant depression again around the time of COVID-19 pandemic which was also around the time she had her daughter which she suspect may have been also concerning for untreated postpartum depression. She would experience fleeting suicidal thoughts that she never acted on but were very distressing. Her alcohol use began to become significantly more frequent and prominent as she began to self-medicate with alcohol in order to address her anxiety and insomnia. She noticed her depressive symptoms and lability significantly worsened in the context of heavy sustained alcohol use. She reports eventually opting to go to [facility] for residential treatment late last year due to feeling the need to seek assistance to manage her alcohol use disorder as well as improve her mood. She attended PHP after attending residential rehab in order to ensure adequate coping skills and assess capacity to handle stress outside of residential treatment. She reports overall doing well at this time and plans to return to work 06/30/23. She feels neither excited nor anxious about returning to work but feels it is the right time for it. She denies current alcohol cravings nor mood instability. She feels she has  become more reliable and even-keel. She denies current somatic complaints from her regiment of duloxetine, buspirone, trazodone, deplin, and naltrexone. She is worried about reliance on trazodone given she had previously used alcohol to address her insomnia. She reports eating appropriately.  She endorses history of generalized anxiety that has fluctuated over several years. She feels this preceded any form of alcohol dependence. She denies history of mania or psychosis.  She denies use of any illicit substances. She currently lives with 2 daughters and husband.   Past Psychiatric History:  Diagnoses: MDD, GAD, alcohol use disorder Medication trials: auvelity (ineffective), lorazepam (sedating), lexapro, mirtazapine, bupropion Previous psychiatrist/therapist: Dr. Adrian Prows Hospitalizations: denies Suicide attempts: denies SIB: denies Hx of violence towards others: denies Current access to guns: denies  Substance Abuse History in the last 12 months:  Yes.  Attended [facility] residential rehab in December 2024 and [facility] January 2025.  Past Medical History:  Past Medical History:  Diagnosis Date  . Anxiety   . Blood transfusion without reported diagnosis    at birth RH incompatibility  . Depression     Past Surgical History:  Procedure Laterality Date  . BREAST ENHANCEMENT SURGERY    . LAPAROSCOPIC APPENDECTOMY N/A 06/09/2019   Procedure: APPENDECTOMY LAPAROSCOPIC;  Surgeon: Gaynelle Adu, MD;  Location: [facility];  Service: General;  Laterality: N/A;  . NASAL SINUS SURGERY       Family History:  Family History  Problem Relation Age of Onset  . Hypertension Father   . Graves' disease Maternal Grandmother   . Diabetes Maternal Grandfather   . Heart attack Paternal Grandfather   . Diabetes Paternal Grandfather     Social History:   Academic/Vocational: full time employment Social History   Socioeconomic History  .  Marital status: Married    Spouse name: Not on file  .  Number of children: Not on file  . Years of education: Not on file  . Highest education level: Not on file  Occupational History  . Not on file  Tobacco Use  . Smoking status: Never  . Smokeless tobacco: Never  Vaping Use  . Vaping status: Never Used  Substance and Sexual Activity  . Alcohol use: Not Currently  . Drug use: Never  . Sexual activity: Not on file  Other Topics Concern  . Not on file  Social History Narrative  . Not on file   Social Drivers of Health   Financial Resource Strain: Low Risk  (07/12/2018)   Overall Financial Resource Strain (CARDIA)   . Difficulty of Paying Living Expenses: Not hard at all  Food Insecurity: No Food Insecurity (07/12/2018)   Hunger Vital Sign   . Worried About Programme researcher, broadcasting/film/video in the Last Year: Never true   . Ran Out of Food in the Last Year: Never true  Transportation Needs: Unknown (07/12/2018)   PRAPARE - Transportation   . Lack of Transportation (Medical): No   . Lack of Transportation (Non-Medical): Not on file  Physical Activity: Not on file  Stress: No Stress Concern Present (07/12/2018)   [employer] of Occupational Health - Occupational Stress Questionnaire   . Feeling of Stress : Only a little  Social Connections: Not on file    Additional Social History: updated  Allergies:  No Known Allergies  Current Medications: Current Outpatient Medications  Medication Sig Dispense Refill  . acetaminophen (TYLENOL) 500 MG tablet You can take 1000 mg of Tylenol/acetaminophen every 8 hours as needed for pain.  For the next 24-48hrs we recommend you just take it on a schedule.  As pain improves you can go back to using it as needed.  You can buy this over-the-counter at any drugstore.  Do not exceed 4000 mg of Tylenol per day it can harm your liver. 30 tablet 0  . busPIRone (BUSPAR) 10 MG tablet Take 1 tablet (10 mg total) by mouth 3 (three) times daily. 90 tablet 0  . DULoxetine (CYMBALTA) 60 MG capsule Take 1 capsule (60 mg  total) by mouth daily. 30 capsule 0  . ibuprofen (ADVIL) 200 MG tablet You can take 2 to 3 tablets every 6 hours as needed for pain.  This is your second pain control medication.  You can alternate this with plain Tylenol/acetaminophen.  You can buy this over-the-counter at any drugstore.    Marland Kitchen L-Methylfolate 15 MG TABS Take 1 tablet (15 mg total) by mouth daily. 90 tablet 0  . Multiple Vitamins-Minerals (MULTIVITAMIN WITH MINERALS) tablet Take 1 tablet by mouth daily.    . naltrexone (DEPADE) 50 MG tablet Take 1 tablet (50 mg total) by mouth at bedtime. 30 tablet 0  . traZODone (DESYREL) 50 MG tablet Take 1 tablet (50 mg total) by mouth at bedtime as needed for sleep. 30 tablet 0   No current facility-administered medications for this visit.    ROS: Review of Systems   Objective:  Psychiatric Specialty Exam:  unknown if currently breastfeeding.There is no height or weight on file to calculate BMI.  General Appearance: Well Groomed  Eye Contact:  Good  Speech:  Clear and Coherent and Normal Rate  Volume:  Normal  Mood:  Euthymic  Affect:  Appropriate and Congruent  Thought Content: Logical   Suicidal Thoughts:  No  Homicidal Thoughts:  No  Thought Process:  Coherent, Goal Directed, and Linear  Orientation:  Full (Time, Place, and Person)  Judgment:  Intact  Insight:  Good  Concentration:  Concentration: Good  Fund of Knowledge: Fair  Language: Fair  Psychomotor Activity:  Normal  Akathisia:  No  AIMS (if indicated): not done  Assets:  Communication Skills Desire for Improvement Financial Resources/Insurance Housing Intimacy Leisure Time Physical Health Resilience Social Support Talents/Skills Transportation Vocational/Educational  ADL's:  Intact  Cognition: WNL      PE: General: well-appearing; no acute distress  Pulm: no increased work of breathing on room air  Strength & Muscle Tone: within normal limits Neuro: no focal neurological deficits observed  Gait  & Station: normal   Screenings:  PHQ2-9    Flowsheet Row [place] MFM OB FOLLOW UP from 11/02/2015 in [facility]  PHQ-2 Total Score 0      Flowsheet Row ED from 11/30/2021 in [facility] at [facility]  C-SSRS RISK CATEGORY No Risk        Park Pope, MD 3/24/20253:31 PM

## 2023-08-04 ENCOUNTER — Encounter (HOSPITAL_COMMUNITY): Payer: Self-pay | Admitting: Student

## 2023-08-07 DIAGNOSIS — M9905 Segmental and somatic dysfunction of pelvic region: Secondary | ICD-10-CM | POA: Diagnosis not present

## 2023-08-07 DIAGNOSIS — M9906 Segmental and somatic dysfunction of lower extremity: Secondary | ICD-10-CM | POA: Diagnosis not present

## 2023-08-07 DIAGNOSIS — M5442 Lumbago with sciatica, left side: Secondary | ICD-10-CM | POA: Diagnosis not present

## 2023-08-07 DIAGNOSIS — M5451 Vertebrogenic low back pain: Secondary | ICD-10-CM | POA: Diagnosis not present

## 2023-08-07 DIAGNOSIS — M9903 Segmental and somatic dysfunction of lumbar region: Secondary | ICD-10-CM | POA: Diagnosis not present

## 2023-08-07 DIAGNOSIS — M7652 Patellar tendinitis, left knee: Secondary | ICD-10-CM | POA: Diagnosis not present

## 2023-08-13 DIAGNOSIS — M9905 Segmental and somatic dysfunction of pelvic region: Secondary | ICD-10-CM | POA: Diagnosis not present

## 2023-08-13 DIAGNOSIS — M5442 Lumbago with sciatica, left side: Secondary | ICD-10-CM | POA: Diagnosis not present

## 2023-08-13 DIAGNOSIS — M9903 Segmental and somatic dysfunction of lumbar region: Secondary | ICD-10-CM | POA: Diagnosis not present

## 2023-08-13 DIAGNOSIS — M7652 Patellar tendinitis, left knee: Secondary | ICD-10-CM | POA: Diagnosis not present

## 2023-08-13 DIAGNOSIS — M9902 Segmental and somatic dysfunction of thoracic region: Secondary | ICD-10-CM | POA: Diagnosis not present

## 2023-08-13 DIAGNOSIS — M5451 Vertebrogenic low back pain: Secondary | ICD-10-CM | POA: Diagnosis not present

## 2023-08-13 DIAGNOSIS — M9906 Segmental and somatic dysfunction of lower extremity: Secondary | ICD-10-CM | POA: Diagnosis not present

## 2023-08-20 DIAGNOSIS — M9902 Segmental and somatic dysfunction of thoracic region: Secondary | ICD-10-CM | POA: Diagnosis not present

## 2023-08-20 DIAGNOSIS — M9903 Segmental and somatic dysfunction of lumbar region: Secondary | ICD-10-CM | POA: Diagnosis not present

## 2023-08-20 DIAGNOSIS — M5451 Vertebrogenic low back pain: Secondary | ICD-10-CM | POA: Diagnosis not present

## 2023-08-20 DIAGNOSIS — M9906 Segmental and somatic dysfunction of lower extremity: Secondary | ICD-10-CM | POA: Diagnosis not present

## 2023-08-20 DIAGNOSIS — M7652 Patellar tendinitis, left knee: Secondary | ICD-10-CM | POA: Diagnosis not present

## 2023-08-20 DIAGNOSIS — M9905 Segmental and somatic dysfunction of pelvic region: Secondary | ICD-10-CM | POA: Diagnosis not present

## 2023-08-20 DIAGNOSIS — M5442 Lumbago with sciatica, left side: Secondary | ICD-10-CM | POA: Diagnosis not present

## 2023-08-26 DIAGNOSIS — M5442 Lumbago with sciatica, left side: Secondary | ICD-10-CM | POA: Diagnosis not present

## 2023-08-26 DIAGNOSIS — M9906 Segmental and somatic dysfunction of lower extremity: Secondary | ICD-10-CM | POA: Diagnosis not present

## 2023-08-26 DIAGNOSIS — M5451 Vertebrogenic low back pain: Secondary | ICD-10-CM | POA: Diagnosis not present

## 2023-08-26 DIAGNOSIS — M9903 Segmental and somatic dysfunction of lumbar region: Secondary | ICD-10-CM | POA: Diagnosis not present

## 2023-08-26 DIAGNOSIS — M7652 Patellar tendinitis, left knee: Secondary | ICD-10-CM | POA: Diagnosis not present

## 2023-08-26 DIAGNOSIS — M9905 Segmental and somatic dysfunction of pelvic region: Secondary | ICD-10-CM | POA: Diagnosis not present

## 2023-09-04 DIAGNOSIS — M7652 Patellar tendinitis, left knee: Secondary | ICD-10-CM | POA: Diagnosis not present

## 2023-09-04 DIAGNOSIS — M9906 Segmental and somatic dysfunction of lower extremity: Secondary | ICD-10-CM | POA: Diagnosis not present

## 2023-09-04 DIAGNOSIS — M9903 Segmental and somatic dysfunction of lumbar region: Secondary | ICD-10-CM | POA: Diagnosis not present

## 2023-09-04 DIAGNOSIS — M5451 Vertebrogenic low back pain: Secondary | ICD-10-CM | POA: Diagnosis not present

## 2023-09-04 DIAGNOSIS — M5442 Lumbago with sciatica, left side: Secondary | ICD-10-CM | POA: Diagnosis not present

## 2023-09-04 DIAGNOSIS — M9905 Segmental and somatic dysfunction of pelvic region: Secondary | ICD-10-CM | POA: Diagnosis not present

## 2023-09-04 DIAGNOSIS — M9902 Segmental and somatic dysfunction of thoracic region: Secondary | ICD-10-CM | POA: Diagnosis not present

## 2023-09-10 DIAGNOSIS — M5451 Vertebrogenic low back pain: Secondary | ICD-10-CM | POA: Diagnosis not present

## 2023-09-10 DIAGNOSIS — M9903 Segmental and somatic dysfunction of lumbar region: Secondary | ICD-10-CM | POA: Diagnosis not present

## 2023-09-10 DIAGNOSIS — M7652 Patellar tendinitis, left knee: Secondary | ICD-10-CM | POA: Diagnosis not present

## 2023-09-10 DIAGNOSIS — M5442 Lumbago with sciatica, left side: Secondary | ICD-10-CM | POA: Diagnosis not present

## 2023-09-10 DIAGNOSIS — M9905 Segmental and somatic dysfunction of pelvic region: Secondary | ICD-10-CM | POA: Diagnosis not present

## 2023-09-13 NOTE — Progress Notes (Unsigned)
 Psychiatric Follow Up Adult Assessment  Patient Identification: Kristine Graves MRN:  [id number] Date of Evaluation:  09/14/2023  Assessment:  Kristine Graves is a 38 y.o. female with a history of generalized anxiety disorder, alcohol use disorder who presents in person to [facility] as a follow up for medication management.  Based on patient's history and assessment on initial visit, her symptoms were consistent with alcohol use disorder in early remission, major depressive disorder, alcohol-induced mood disorder, and generalized anxiety disorder.    Patient remains abstinent from alcohol and reports relative stability in mood and anxiety. She continues to take her medications as prescribed and has been able to take buspirone  15 mg twice a day without issue.  She continues to feel that her medications are helpful in management of her depression and anxiety. She denies alcohol craving and continues to feel that she has a strong social support through family, AA, therapist, and sponsor. She continues to see her therapist weekly who is a substance use counselor. Plan for her to continue psychotropic regiment at this time and follow up in one month to identify if there are any additional titration of medications required. Plan to get updated labs next visit.   Plan:  # Alcohol Use Disorder, in early remission Past medication trials:  Interventions: -- Last Use: December 2024 -- Continue abstaining from alcohol -- continue naltrexone  50 mg at bedtime  -ordered labs: CBC, CMP, vitamin D, and TSH   # Major Depressive Disorder, in remission # History of Postpartum Depression # History of alcohol induced mood disorder # Generalized Anxiety Disorder Past medication trials:  Status of problem: remission Interventions: -- continue duloxetine  60 mg daily  -- Continue buspirone  to 15 bid -- continue trazodone  25-50 mg at bedtime prn  # MTHFR gene variant Past  medication trials:  Interventions: -- Continue methylfolate 15 mg daily  Hx of macrocytic anemia --continue MVI supplementation  Return to care in 4 weeks  Patient was given contact information for behavioral health clinic and was instructed to call 911 for emergencies.    Patient and plan of care will be discussed with the Attending MD, Dr. Sharalyn Dasen, who agrees with the above statement and plan.   Subjective:  Chief Complaint: Medication Management  Interval History: Patient presents in person for follow up. She denies SI/HI/AVH.  She reports eating and sleeping well. She continues to take the trazodone  at 50 mg every night but will trial half a dose at some point to evaluate if her sleep continues to be ok. She denies any acute alcohol cravings and feels well supported with regards to her recovery and continued abstinence from alcohol.  She denies any acute somatic complaints from current medication regiment.  Buspirone  twice a day dosing has been much more manageable. Patient has experienced work stress but not anxiety from it. She is flying for a business trip and husband will fly with her on the way to and coworkers on the way back which will be helpful for her as airports were where she drank alcohol in the past. Patient would like to obtain labs next visit to have them updated.  Past Psychiatric History:  Diagnoses: MDD, GAD, alcohol use disorder Medication trials: auvelity (ineffective), lorazepam  (sedating), lexapro, mirtazapine, bupropion Previous psychiatrist/therapist: Dr. Mary Sly Hospitalizations: denies Suicide attempts: denies SIB: denies Hx of violence towards others: denies Current access to guns: denies  Substance Abuse History in the last 12 months:  Yes.  Attended [facility] residential  rehab in December 2024 and [facility] January 2025.  Past Medical History:  Past Medical History:  Diagnosis Date   Anxiety    Blood transfusion without reported diagnosis     at birth RH incompatibility   Depression     Past Surgical History:  Procedure Laterality Date   BREAST ENHANCEMENT SURGERY     LAPAROSCOPIC APPENDECTOMY N/A 06/09/2019   Procedure: APPENDECTOMY LAPAROSCOPIC;  Surgeon: Aldean Hummingbird, MD;  Location: [facility] OR;  Service: General;  Laterality: N/A;   NASAL SINUS SURGERY       Family History:  Family History  Problem Relation Age of Onset   Hypertension Father    Graves' disease Maternal Grandmother    Diabetes Maternal Grandfather    Heart attack Paternal Grandfather    Diabetes Paternal Grandfather     Social History:   Academic/Vocational: full time employment Social History   Socioeconomic History   Marital status: Married    Spouse name: Not on file   Number of children: Not on file   Years of education: Not on file   Highest education level: Not on file  Occupational History   Not on file  Tobacco Use   Smoking status: Never   Smokeless tobacco: Never  Vaping Use   Vaping status: Never Used  Substance and Sexual Activity   Alcohol use: Not Currently   Drug use: Never   Sexual activity: Not on file  Other Topics Concern   Not on file  Social History Narrative   Not on file   Social Drivers of Health   Financial Resource Strain: Low Risk  (07/12/2018)   Overall Financial Resource Strain (CARDIA)    Difficulty of Paying Living Expenses: Not hard at all  Food Insecurity: No Food Insecurity (07/12/2018)   Hunger Vital Sign    Worried About Running Out of Food in the Last Year: Never true    Ran Out of Food in the Last Year: Never true  Transportation Needs: Unknown (07/12/2018)   PRAPARE - Administrator, Civil Service (Medical): No    Lack of Transportation (Non-Medical): Not on file  Physical Activity: Not on file  Stress: No Stress Concern Present (07/12/2018)   [employer] of Occupational Health - Occupational Stress Questionnaire    Feeling of Stress : Only a little  Social Connections: Not on  file    Additional Social History: updated  Allergies:  No Known Allergies  Current Medications: Current Outpatient Medications  Medication Sig Dispense Refill   acetaminophen  (TYLENOL ) 500 MG tablet You can take 1000 mg of Tylenol /acetaminophen  every 8 hours as needed for pain.  For the next 24-48hrs we recommend you just take it on a schedule.  As pain improves you can go back to using it as needed.  You can buy this over-the-counter at any drugstore.  Do not exceed 4000 mg of Tylenol  per day it can harm your liver. 30 tablet 0   busPIRone  (BUSPAR ) 15 MG tablet Take 1 tablet (15 mg total) by mouth 2 (two) times daily. 180 tablet 0   DULoxetine  (CYMBALTA ) 60 MG capsule Take 1 capsule (60 mg total) by mouth daily. 90 capsule 0   ibuprofen  (ADVIL ) 200 MG tablet You can take 2 to 3 tablets every 6 hours as needed for pain.  This is your second pain control medication.  You can alternate this with plain Tylenol /acetaminophen .  You can buy this over-the-counter at any drugstore.     L-Methylfolate 15 MG TABS  Take 1 tablet (15 mg total) by mouth daily. 90 tablet 0   Multiple Vitamins-Minerals (MULTIVITAMIN WITH MINERALS) tablet Take 1 tablet by mouth daily.     naltrexone  (DEPADE) 50 MG tablet Take 1 tablet (50 mg total) by mouth at bedtime. 90 tablet 0   traZODone  (DESYREL ) 50 MG tablet Take 0.5-1 tablets (25-50 mg total) by mouth at bedtime as needed for sleep. 90 tablet 0   No current facility-administered medications for this visit.    ROS: Review of Systems   Objective:  Psychiatric Specialty Exam:  unknown if currently breastfeeding.There is no height or weight on file to calculate BMI.  General Appearance: Well Groomed  Eye Contact:  Good  Speech:  Clear and Coherent and Normal Rate  Volume:  Normal  Mood:  Euthymic  Affect:  Appropriate and Congruent  Thought Content: Logical   Suicidal Thoughts:  No  Homicidal Thoughts:  No  Thought Process:  Coherent, Goal Directed, and  Linear  Orientation:  Full (Time, Place, and Person)  Judgment:  Intact  Insight:  Good  Concentration:  Concentration: Good  Fund of Knowledge: Fair  Language: Fair  Psychomotor Activity:  Normal  Akathisia:  No  AIMS (if indicated): not done  Assets:  Communication Skills Desire for Improvement Financial Resources/Insurance Housing Intimacy Leisure Time Physical Health Resilience Social Support Talents/Skills Transportation Vocational/Educational  ADL's:  Intact  Cognition: WNL      PE: General: well-appearing; no acute distress  Pulm: no increased work of breathing on room air  Strength & Muscle Tone: within normal limits Neuro: no focal neurological deficits observed  Gait & Station: normal   Screenings:  PHQ2-9    Flowsheet Row US  MFM OB FOLLOW UP from 11/02/2015 in [facility]  PHQ-2 Total Score 0      Flowsheet Row ED from [facility]30/20[facility] in [facility]  C-SSRS RISK CATEGORY No Risk        Augusta Blizzard, MD 5/4/20254:28 PM

## 2023-09-14 ENCOUNTER — Encounter (HOSPITAL_COMMUNITY): Payer: Self-pay | Admitting: Student

## 2023-09-14 ENCOUNTER — Ambulatory Visit (HOSPITAL_BASED_OUTPATIENT_CLINIC_OR_DEPARTMENT_OTHER): Admitting: Student

## 2023-09-14 VITALS — BP 117/79 | HR 68 | Wt 144.0 lb

## 2023-09-14 DIAGNOSIS — F411 Generalized anxiety disorder: Secondary | ICD-10-CM | POA: Diagnosis not present

## 2023-09-14 DIAGNOSIS — F3340 Major depressive disorder, recurrent, in remission, unspecified: Secondary | ICD-10-CM | POA: Diagnosis not present

## 2023-09-14 DIAGNOSIS — F1021 Alcohol dependence, in remission: Secondary | ICD-10-CM

## 2023-09-14 MED ORDER — DULOXETINE HCL 60 MG PO CPEP: 60.0000 mg | ORAL_CAPSULE | Freq: Every day | ORAL | 0 refills | Status: DC

## 2023-09-14 MED ORDER — TRAZODONE HCL 50 MG PO TABS: 25.0000 mg | ORAL_TABLET | Freq: Every evening | ORAL | 0 refills | Status: DC | PRN

## 2023-09-14 MED ORDER — NALTREXONE HCL 50 MG PO TABS: 50.0000 mg | ORAL_TABLET | Freq: Every day | ORAL | 0 refills | Status: DC

## 2023-09-14 MED ORDER — BUSPIRONE HCL 15 MG PO TABS: 15.0000 mg | ORAL_TABLET | Freq: Two times a day (BID) | ORAL | 0 refills | Status: DC

## 2023-09-15 NOTE — Addendum Note (Signed)
 Addended by: Donnelly Gainer on: 09/15/2023 09:44 AM   Modules accepted: Level of Service

## 2023-09-18 DIAGNOSIS — M5442 Lumbago with sciatica, left side: Secondary | ICD-10-CM | POA: Diagnosis not present

## 2023-09-18 DIAGNOSIS — M9902 Segmental and somatic dysfunction of thoracic region: Secondary | ICD-10-CM | POA: Diagnosis not present

## 2023-09-18 DIAGNOSIS — M7652 Patellar tendinitis, left knee: Secondary | ICD-10-CM | POA: Diagnosis not present

## 2023-09-18 DIAGNOSIS — M9903 Segmental and somatic dysfunction of lumbar region: Secondary | ICD-10-CM | POA: Diagnosis not present

## 2023-09-18 DIAGNOSIS — M9905 Segmental and somatic dysfunction of pelvic region: Secondary | ICD-10-CM | POA: Diagnosis not present

## 2023-10-01 DIAGNOSIS — M7652 Patellar tendinitis, left knee: Secondary | ICD-10-CM | POA: Diagnosis not present

## 2023-10-01 DIAGNOSIS — M9902 Segmental and somatic dysfunction of thoracic region: Secondary | ICD-10-CM | POA: Diagnosis not present

## 2023-10-01 DIAGNOSIS — M5442 Lumbago with sciatica, left side: Secondary | ICD-10-CM | POA: Diagnosis not present

## 2023-10-01 DIAGNOSIS — M9905 Segmental and somatic dysfunction of pelvic region: Secondary | ICD-10-CM | POA: Diagnosis not present

## 2023-10-01 DIAGNOSIS — M9903 Segmental and somatic dysfunction of lumbar region: Secondary | ICD-10-CM | POA: Diagnosis not present

## 2023-10-18 NOTE — Progress Notes (Deleted)
 Psychiatric Follow Up Adult Assessment  Patient Identification: Kristine Graves MRN:  [id number] Date of Evaluation:  10/19/2023  Assessment:  Kristine Graves is a 38 y.o. female with a history of generalized anxiety disorder, alcohol use disorder who presents in person to [facility] as a follow up for medication management.  Based on patient's history and assessment on initial visit, her symptoms were consistent with alcohol use disorder in early remission, major depressive disorder, alcohol-induced mood disorder, and generalized anxiety disorder.    Patient remains abstinent from alcohol and reports relative stability in mood and anxiety. She continues to take her medications as prescribed and has been able to take buspirone  15 mg twice a day without issue.  She continues to feel that her medications are helpful in management of her depression and anxiety. She denies alcohol craving and continues to feel that she has a strong social support through family, AA, therapist, and sponsor. She continues to see her therapist weekly who is a substance use counselor. Plan for her to continue psychotropic regiment at this time and follow up in one month to identify if there are any additional titration of medications required. Plan to get updated labs next visit.   Plan:  # Alcohol Use Disorder, in early remission Past medication trials:  Interventions: -- Last Use: December 2024 -- Continue abstaining from alcohol -- continue naltrexone  50 mg at bedtime  -ordered labs: CBC, CMP, vitamin D, and TSH   # Major Depressive Disorder, in remission # History of Postpartum Depression # History of alcohol induced mood disorder # Generalized Anxiety Disorder Past medication trials:  Status of problem: remission Interventions: -- continue duloxetine  60 mg daily  -- Continue buspirone  to 15 bid -- continue trazodone  25-50 mg at bedtime prn  # MTHFR gene variant Past  medication trials:  Interventions: -- Continue methylfolate 15 mg daily  Hx of macrocytic anemia --continue MVI supplementation  Return to care in 4 weeks  Patient was given contact information for behavioral health clinic and was instructed to call 911 for emergencies.    Patient and plan of care will be discussed with the Attending MD, Dr. Sharalyn Dasen, who agrees with the above statement and plan.   Subjective:  Chief Complaint: Medication Management  Interval History: Patient presents in person for follow up. She denies SI/HI/AVH.  She reports eating and sleeping well. She continues to take the trazodone  at 50 mg every night but will trial half a dose at some point to evaluate if her sleep continues to be ok. She denies any acute alcohol cravings and feels well supported with regards to her recovery and continued abstinence from alcohol.  She denies any acute somatic complaints from current medication regiment.  Buspirone  twice a day dosing has been much more manageable. Patient has experienced work stress but not anxiety from it. She is flying for a business trip and husband will fly with her on the way to and coworkers on the way back which will be helpful for her as airports were where she drank alcohol in the past. Patient would like to obtain labs next visit to have them updated.  Past Psychiatric History:  Diagnoses: MDD, GAD, alcohol use disorder Medication trials: auvelity (ineffective), lorazepam  (sedating), lexapro, mirtazapine, bupropion Previous psychiatrist/therapist: Dr. Mary Sly Hospitalizations: denies Suicide attempts: denies SIB: denies Hx of violence towards others: denies Current access to guns: denies  Substance Abuse History in the last 12 months:  Yes.  Attended [facility] residential  rehab in December 2024 and [facility] January 2025.  Past Medical History:  Past Medical History:  Diagnosis Date   Abnormal maternal serum screening test 06/07/2015   1:39  Down syndrome risk from first trimester screening- NIPS drawn 06/07/15     Acute appendicitis 06/09/2019   Anxiety    Blood transfusion without reported diagnosis    at birth RH incompatibility   Depression    Elevated liver enzymes 11/30/2021   High risk pregnancy with low PAPPA 06/07/2015   History of appendectomy 11/30/2021   Hypercalcemia 11/30/2021   Hyponatremia 11/30/2021   Indication for care in labor or delivery 07/13/2018   Metabolic acidosis, increased anion gap 11/30/2021   Polycythemia 11/30/2021   SBO (small bowel obstruction) (HCC) 11/30/2021   SVD (spontaneous vaginal delivery) 07/13/2018    Past Surgical History:  Procedure Laterality Date   BREAST ENHANCEMENT SURGERY     LAPAROSCOPIC APPENDECTOMY N/A 06/09/2019   Procedure: APPENDECTOMY LAPAROSCOPIC;  Surgeon: Aldean Hummingbird, MD;  Location: [facility] OR;  Service: General;  Laterality: N/A;   NASAL SINUS SURGERY       Family History:  Family History  Problem Relation Age of Onset   Hypertension Father    Graves' disease Maternal Grandmother    Diabetes Maternal Grandfather    Heart attack Paternal Grandfather    Diabetes Paternal Grandfather     Social History:   Academic/Vocational: full time employment Social History   Socioeconomic History   Marital status: Married    Spouse name: Not on file   Number of children: Not on file   Years of education: Not on file   Highest education level: Not on file  Occupational History   Not on file  Tobacco Use   Smoking status: Never   Smokeless tobacco: Never  Vaping Use   Vaping status: Never Used  Substance and Sexual Activity   Alcohol use: Not Currently   Drug use: Never   Sexual activity: Not on file  Other Topics Concern   Not on file  Social History Narrative   Not on file   Social Drivers of Health   Financial Resource Strain: Low Risk  (07/12/2018)   Overall Financial Resource Strain (CARDIA)    Difficulty of Paying Living Expenses: Not hard at all   Food Insecurity: No Food Insecurity (07/12/2018)   Hunger Vital Sign    Worried About Running Out of Food in the Last Year: Never true    Ran Out of Food in the Last Year: Never true  Transportation Needs: Unknown (07/12/2018)   PRAPARE - Administrator, Civil Service (Medical): No    Lack of Transportation (Non-Medical): Not on file  Physical Activity: Not on file  Stress: No Stress Concern Present (07/12/2018)   [employer] of Occupational Health - Occupational Stress Questionnaire    Feeling of Stress : Only a little  Social Connections: Not on file    Additional Social History: updated  Allergies:  No Known Allergies  Current Medications: Current Outpatient Medications  Medication Sig Dispense Refill   acetaminophen  (TYLENOL ) 500 MG tablet You can take 1000 mg of Tylenol /acetaminophen  every 8 hours as needed for pain.  For the next 24-48hrs we recommend you just take it on a schedule.  As pain improves you can go back to using it as needed.  You can buy this over-the-counter at any drugstore.  Do not exceed 4000 mg of Tylenol  per day it can harm your liver. 30 tablet 0  busPIRone  (BUSPAR ) 15 MG tablet Take 1 tablet (15 mg total) by mouth 2 (two) times daily. 180 tablet 0   DULoxetine  (CYMBALTA ) 60 MG capsule Take 1 capsule (60 mg total) by mouth daily. 90 capsule 0   ibuprofen  (ADVIL ) 200 MG tablet You can take 2 to 3 tablets every 6 hours as needed for pain.  This is your second pain control medication.  You can alternate this with plain Tylenol /acetaminophen .  You can buy this over-the-counter at any drugstore.     L-Methylfolate 15 MG TABS Take 1 tablet (15 mg total) by mouth daily. 90 tablet 0   Multiple Vitamins-Minerals (MULTIVITAMIN WITH MINERALS) tablet Take 1 tablet by mouth daily.     naltrexone  (DEPADE) 50 MG tablet Take 1 tablet (50 mg total) by mouth at bedtime. 90 tablet 0   traZODone  (DESYREL ) 50 MG tablet Take 0.5-1 tablets (25-50 mg total) by mouth at  bedtime as needed for sleep. 90 tablet 0   No current facility-administered medications for this visit.    ROS: Review of Systems   Objective:  Psychiatric Specialty Exam:  unknown if currently breastfeeding.There is no height or weight on file to calculate BMI.  General Appearance: Well Groomed  Eye Contact:  Good  Speech:  Clear and Coherent and Normal Rate  Volume:  Normal  Mood:  Euthymic  Affect:  Appropriate and Congruent  Thought Content: Logical   Suicidal Thoughts:  No  Homicidal Thoughts:  No  Thought Process:  Coherent, Goal Directed, and Linear  Orientation:  Full (Time, Place, and Person)  Judgment:  Intact  Insight:  Good  Concentration:  Concentration: Good  Fund of Knowledge: Fair  Language: Fair  Psychomotor Activity:  Normal  Akathisia:  No  AIMS (if indicated): not done  Assets:  Communication Skills Desire for Improvement Financial Resources/Insurance Housing Intimacy Leisure Time Physical Health Resilience Social Support Talents/Skills Transportation Vocational/Educational  ADL's:  Intact  Cognition: WNL      PE: General: well-appearing; no acute distress  Pulm: no increased work of breathing on room air  Strength & Muscle Tone: within normal limits Neuro: no focal neurological deficits observed  Gait & Station: normal   Screenings:  PHQ2-9    Flowsheet Row US  MFM OB FOLLOW UP from 11/02/2015 in [facility] Ul[facility]ound  PHQ-2 Total Score 0      Flowsheet Row ED from 11/30/2021 in [facility] Emergency Department at [facility]  C-SSRS RISK CATEGORY No Risk        Augusta Blizzard, MD 6/8/20252:25 PM

## 2023-10-19 ENCOUNTER — Ambulatory Visit (INDEPENDENT_AMBULATORY_CARE_PROVIDER_SITE_OTHER): Admitting: Family Medicine

## 2023-10-19 ENCOUNTER — Ambulatory Visit (HOSPITAL_COMMUNITY): Admitting: Student

## 2023-10-19 ENCOUNTER — Encounter: Payer: Self-pay | Admitting: Family Medicine

## 2023-10-19 VITALS — BP 108/74 | Ht 69.0 in | Wt 142.0 lb

## 2023-10-19 DIAGNOSIS — M545 Low back pain, unspecified: Secondary | ICD-10-CM | POA: Diagnosis not present

## 2023-10-19 DIAGNOSIS — M25552 Pain in left hip: Secondary | ICD-10-CM

## 2023-10-19 NOTE — Patient Instructions (Signed)
 Get x-rays when you leave today. Start PT at [facility] [address], (First Floor)  [place], [place] [address] Phone: [phone]  Follow up in 6-8 weeks.

## 2023-10-19 NOTE — Progress Notes (Signed)
    SUBJECTIVE:   CHIEF COMPLAINT / HPI:   Kristine Graves is a 38yo F pf L hip pain - Pain started in February - Was initially in anterior L hip, then spread to L inguinal crease/groin area. The pain is sharp - She also feels tightness in her L lateral thigh and lateral knee. - Pain is worse when she has been sitting for a really long time and then stands up. As she walks, the pain improves. - She has continued to exercise. She reports exercising slightly more starting in January.  - She has been seeing a chiropractor and getting adjustments for the past few months.  She has also been getting dry needling done. - Not taking meds for it - No preceding trauma to the area  OBJECTIVE:   BP 108/74 (BP Location: Left Arm, Patient Position: Sitting, Cuff Size: Normal)   Ht 5\' 9"  (1.753 m)   Wt 142 lb (64.4 kg)   BMI 20.97 kg/m   General: Alert, pleasant woman. NAD. HEENT: NCAT. MMM.  Resp:  Normal WOB on RA.   MSK: Tenderness to palpation along L anterior hip joint. Mild discomfort to palpation along IT band distribution and L lateral knee. Pain with FABIR test.  ASSESSMENT/PLAN:   Assessment & Plan Left hip pain Differential includes hip OA, hip impingement, radiculopathy. No red flag signs such as weakness, incontinence, numbness.  - XR of L hip, pelvis, and lumbar back to further evaluate - Start PT for strengthening - f/u in 6-8 wks   Albin Huh, MD [facility] Medicine [facility]

## 2023-10-22 ENCOUNTER — Ambulatory Visit
Admission: RE | Admit: 2023-10-22 | Discharge: 2023-10-22 | Disposition: A | Discharge status: Home / Self Care | Source: Ambulatory Visit | Attending: Family Medicine | Admitting: Family Medicine

## 2023-10-22 DIAGNOSIS — M545 Low back pain, unspecified: Secondary | ICD-10-CM | POA: Diagnosis not present

## 2023-10-22 DIAGNOSIS — M25552 Pain in left hip: Secondary | ICD-10-CM | POA: Diagnosis not present

## 2023-10-26 ENCOUNTER — Encounter (HOSPITAL_COMMUNITY): Payer: Self-pay | Admitting: Student

## 2023-10-26 ENCOUNTER — Ambulatory Visit: Payer: Self-pay | Admitting: Family Medicine

## 2023-10-26 ENCOUNTER — Ambulatory Visit (HOSPITAL_BASED_OUTPATIENT_CLINIC_OR_DEPARTMENT_OTHER): Admitting: Student

## 2023-10-26 DIAGNOSIS — M9902 Segmental and somatic dysfunction of thoracic region: Secondary | ICD-10-CM | POA: Diagnosis not present

## 2023-10-26 DIAGNOSIS — F3340 Major depressive disorder, recurrent, in remission, unspecified: Secondary | ICD-10-CM

## 2023-10-26 DIAGNOSIS — F1021 Alcohol dependence, in remission: Secondary | ICD-10-CM | POA: Diagnosis not present

## 2023-10-26 DIAGNOSIS — M5442 Lumbago with sciatica, left side: Secondary | ICD-10-CM | POA: Diagnosis not present

## 2023-10-26 DIAGNOSIS — M9903 Segmental and somatic dysfunction of lumbar region: Secondary | ICD-10-CM | POA: Diagnosis not present

## 2023-10-26 DIAGNOSIS — Z1231 Encounter for screening mammogram for malignant neoplasm of breast: Secondary | ICD-10-CM | POA: Diagnosis not present

## 2023-10-26 DIAGNOSIS — M7652 Patellar tendinitis, left knee: Secondary | ICD-10-CM | POA: Diagnosis not present

## 2023-10-26 DIAGNOSIS — M9905 Segmental and somatic dysfunction of pelvic region: Secondary | ICD-10-CM | POA: Diagnosis not present

## 2023-10-26 DIAGNOSIS — Z6820 Body mass index (BMI) 20.0-20.9, adult: Secondary | ICD-10-CM | POA: Diagnosis not present

## 2023-10-26 DIAGNOSIS — Z01419 Encounter for gynecological examination (general) (routine) without abnormal findings: Secondary | ICD-10-CM | POA: Diagnosis not present

## 2023-10-26 NOTE — Addendum Note (Signed)
 Addended by: Donnelly Gainer on: 10/26/2023 12:08 PM   Modules accepted: Level of Service

## 2023-10-26 NOTE — Progress Notes (Signed)
 Psychiatric Follow Up Adult Assessment  Patient Identification: Kristine Graves MRN:  [id number] Date of Evaluation:  10/26/2023  Assessment:  Kristine Graves is a 38 y.o. female with a history of generalized anxiety disorder, alcohol use disorder who presents in person to [facility] as a follow up for medication management.  Based on patient's history and assessment on initial visit, her symptoms were consistent with alcohol use disorder in early remission, major depressive disorder, alcohol-induced mood disorder, and generalized anxiety disorder.    Patient remains abstinent from alcohol and reports relative stability in mood and anxiety. She continues to take her medications as prescribed and has been able to maintain compliance with daily medication regiment.  She continues to feel that her medications are helpful in management of her depression and anxiety. She denies alcohol craving and continues to feel that she has a strong social support through family, AA, therapist, and sponsor. She continues to see her therapist weekly who is a substance use counselor. Plan for her to continue psychotropic regiment at this time and follow up in one month to identify if there are any additional titration of medications required. Patient will get updated labs today.  Plan:  # Alcohol Use Disorder, in early remission Past medication trials:  Interventions: -- Last Use: December 2024 -- Continue abstaining from alcohol -- continue naltrexone  50 mg at bedtime  -ordered labs: CBC, CMP, vitamin D, and TSH   # Major Depressive Disorder, in remission # History of Postpartum Depression # History of alcohol induced mood disorder # Generalized Anxiety Disorder Past medication trials:  Status of problem: remission Interventions: -- continue duloxetine  60 mg daily  -- Continue buspirone  to 15 bid -- continue trazodone  25-50 mg at bedtime prn  # MTHFR gene variant Past  medication trials:  Interventions: -- continue MVI  Hx of macrocytic anemia --continue MVI supplementation  Return to care in 4 weeks  Patient was given contact information for behavioral health clinic and was instructed to call 911 for emergencies.    Patient and plan of care will be discussed with the Attending MD, Dr. Sharalyn Dasen, who agrees with the above statement and plan.   Subjective:  Chief Complaint: Medication Management  Interval History: Patient presents in person for follow up. She denies SI/HI/AVH.  She reports eating and sleeping well.  She reports her mood has been stable.  She was able to handle being around people who are drinking alcohol and managing environments that she had previously drank heavily (such as airport and hotel room). She continues to go to AA and even went to an AA meeting at her conference she went to. Patient amenable to continuing psychotropics as prescribed and following up with me in ~1 month.  Past Psychiatric History:  Diagnoses: MDD, GAD, alcohol use disorder Medication trials: auvelity (ineffective), lorazepam  (sedating), lexapro, mirtazapine, bupropion Previous psychiatrist/therapist: Dr. Mary Sly Hospitalizations: denies Suicide attempts: denies SIB: denies Hx of violence towards others: denies Current access to guns: denies  Substance Abuse History in the last 12 months:  Yes.  Attended [facility] residential rehab in December 2024 and [facility] January 2025.  Past Medical History:  Past Medical History:  Diagnosis Date   Abnormal maternal serum screening test 06/07/2015   1:39 Down syndrome risk from first trimester screening- NIPS drawn 06/07/15     Acute appendicitis 06/09/2019   Anxiety    Blood transfusion without reported diagnosis    at birth RH incompatibility   Depression  Elevated liver enzymes 11/30/2021   High risk pregnancy with low PAPPA 06/07/2015   History of appendectomy 11/30/2021   Hypercalcemia  11/30/2021   Hyponatremia 11/30/2021   Indication for care in labor or delivery 07/13/2018   Metabolic acidosis, increased anion gap 11/30/2021   Polycythemia 11/30/2021   SBO (small bowel obstruction) (HCC) 11/30/2021   SVD (spontaneous vaginal delivery) 07/13/2018    Past Surgical History:  Procedure Laterality Date   BREAST ENHANCEMENT SURGERY     LAPAROSCOPIC APPENDECTOMY N/A 06/09/2019   Procedure: APPENDECTOMY LAPAROSCOPIC;  Surgeon: Aldean Hummingbird, MD;  Location: [facility] OR;  Service: General;  Laterality: N/A;   NASAL SINUS SURGERY       Family History:  Family History  Problem Relation Age of Onset   Hypertension Father    Graves' disease Maternal Grandmother    Diabetes Maternal Grandfather    Heart attack Paternal Grandfather    Diabetes Paternal Grandfather     Social History:   Academic/Vocational: full time employment Social History   Socioeconomic History   Marital status: Married    Spouse name: Not on file   Number of children: Not on file   Years of education: Not on file   Highest education level: Not on file  Occupational History   Not on file  Tobacco Use   Smoking status: Never   Smokeless tobacco: Never  Vaping Use   Vaping status: Never Used  Substance and Sexual Activity   Alcohol use: Not Currently   Drug use: Never   Sexual activity: Not on file  Other Topics Concern   Not on file  Social History Narrative   Not on file   Social Drivers of Health   Financial Resource Strain: Low Risk  (07/12/2018)   Overall Financial Resource Strain (CARDIA)    Difficulty of Paying Living Expenses: Not hard at all  Food Insecurity: No Food Insecurity (07/12/2018)   Hunger Vital Sign    Worried About Running Out of Food in the Last Year: Never true    Ran Out of Food in the Last Year: Never true  Transportation Needs: Unknown (07/12/2018)   PRAPARE - Administrator, Civil Service (Medical): No    Lack of Transportation (Non-Medical): Not on file   Physical Activity: Not on file  Stress: No Stress Concern Present (07/12/2018)   [employer] of Occupational Health - Occupational Stress Questionnaire    Feeling of Stress : Only a little  Social Connections: Not on file    Additional Social History: updated  Allergies:  No Known Allergies  Current Medications: Current Outpatient Medications  Medication Sig Dispense Refill   acetaminophen  (TYLENOL ) 500 MG tablet You can take 1000 mg of Tylenol /acetaminophen  every 8 hours as needed for pain.  For the next 24-48hrs we recommend you just take it on a schedule.  As pain improves you can go back to using it as needed.  You can buy this over-the-counter at any drugstore.  Do not exceed 4000 mg of Tylenol  per day it can harm your liver. 30 tablet 0   busPIRone  (BUSPAR ) 15 MG tablet Take 1 tablet (15 mg total) by mouth 2 (two) times daily. 180 tablet 0   DULoxetine  (CYMBALTA ) 60 MG capsule Take 1 capsule (60 mg total) by mouth daily. 90 capsule 0   ibuprofen  (ADVIL ) 200 MG tablet You can take 2 to 3 tablets every 6 hours as needed for pain.  This is your second pain control medication.  You  can alternate this with plain Tylenol /acetaminophen .  You can buy this over-the-counter at any drugstore.     L-Methylfolate 15 MG TABS Take 1 tablet (15 mg total) by mouth daily. 90 tablet 0   Multiple Vitamins-Minerals (MULTIVITAMIN WITH MINERALS) tablet Take 1 tablet by mouth daily.     naltrexone  (DEPADE) 50 MG tablet Take 1 tablet (50 mg total) by mouth at bedtime. 90 tablet 0   traZODone  (DESYREL ) 50 MG tablet Take 0.5-1 tablets (25-50 mg total) by mouth at bedtime as needed for sleep. 90 tablet 0   No current facility-administered medications for this visit.    ROS: Review of Systems   Objective:  Psychiatric Specialty Exam:  unknown if currently breastfeeding.There is no height or weight on file to calculate BMI.  General Appearance: Well Groomed  Eye Contact:  Good  Speech:  Clear and  Coherent and Normal Rate  Volume:  Normal  Mood:  Euthymic  Affect:  Appropriate and Congruent  Thought Content: Logical   Suicidal Thoughts:  No  Homicidal Thoughts:  No  Thought Process:  Coherent, Goal Directed, and Linear  Orientation:  Full (Time, Place, and Person)  Judgment:  Intact  Insight:  Good  Concentration:  Concentration: Good  Fund of Knowledge: Fair  Language: Fair  Psychomotor Activity:  Normal  Akathisia:  No  AIMS (if indicated): not done  Assets:  Communication Skills Desire for Improvement Financial Resources/Insurance Housing Intimacy Leisure Time Physical Health Resilience Social Support Talents/Skills Transportation Vocational/Educational  ADL's:  Intact  Cognition: WNL      PE: General: well-appearing; no acute distress  Pulm: no increased work of breathing on room air  Strength & Muscle Tone: within normal limits Neuro: no focal neurological deficits observed  Gait & Station: normal   Screenings:  PHQ2-9    Flowsheet Row US  MFM OB FOLLOW UP from 11/02/2015 in [facility] - [facility] Ultrasound  PHQ-2 Total Score 0   Flowsheet Row ED from 11/30/2021 in [facility] Emergency Department at [facility]  C-SSRS RISK CATEGORY No Risk     Augusta Blizzard, MD 6/16/20259:28 AM

## 2023-10-27 LAB — COMPREHENSIVE METABOLIC PANEL WITH GFR
ALT: 8 IU/L (ref 0–32)
AST: 15 IU/L (ref 0–40)
Albumin: 4.8 g/dL (ref 3.9–4.9)
Alkaline Phosphatase: 45 IU/L (ref 44–121)
BUN/Creatinine Ratio: 12 (ref 9–23)
BUN: 12 mg/dL (ref 6–24)
Bilirubin Total: 0.4 mg/dL (ref 0.0–1.2)
CO2: 16 mmol/L — ABNORMAL LOW (ref 20–29)
Calcium: 9.9 mg/dL (ref 8.7–10.2)
Chloride: 105 mmol/L (ref 96–106)
Creatinine, Ser: 0.97 mg/dL (ref 0.57–1.00)
Globulin, Total: 2.6 g/dL (ref 1.5–4.5)
Glucose: 45 mg/dL — ABNORMAL LOW (ref 70–99)
Potassium: 4.2 mmol/L (ref 3.5–5.2)
Sodium: 140 mmol/L (ref 134–144)
Total Protein: 7.4 g/dL (ref 6.0–8.5)
eGFR: 76 mL/min/{1.73_m2} (ref >59)

## 2023-10-27 LAB — CBC WITH DIFFERENTIAL/PLATELET
Basophils Absolute: 0 10*3/uL (ref 0.0–0.2)
Basos: 1 %
EOS (ABSOLUTE): 0.2 10*3/uL (ref 0.0–0.4)
Eos: 7 %
Hematocrit: 41.5 % (ref 34.0–46.6)
Hemoglobin: 13.3 g/dL (ref 11.1–15.9)
Immature Grans (Abs): 0 10*3/uL (ref 0.0–0.1)
Immature Granulocytes: 0 %
Lymphocytes Absolute: 1.1 10*3/uL (ref 0.7–3.1)
Lymphs: 35 %
MCH: 30 pg (ref 26.6–33.0)
MCHC: 32 g/dL (ref 31.5–35.7)
MCV: 94 fL (ref 79–97)
Monocytes Absolute: 0.2 10*3/uL (ref 0.1–0.9)
Monocytes: 5 %
Neutrophils Absolute: 1.7 10*3/uL (ref 1.4–7.0)
Neutrophils: 52 %
Platelets: 121 10*3/uL — ABNORMAL LOW (ref 150–450)
RBC: 4.43 x10E6/uL (ref 3.77–5.28)
RDW: 12.5 % (ref 11.7–15.4)
WBC: 3.2 10*3/uL — ABNORMAL LOW (ref 3.4–10.8)

## 2023-10-27 LAB — TSH+FREE T4
Free T4: 1.1 ng/dL (ref 0.82–1.77)
TSH: 1.58 u[IU]/mL (ref 0.450–4.500)

## 2023-10-27 LAB — VITAMIN D 25 HYDROXY (VIT D DEFICIENCY, FRACTURES): Vit D, 25-Hydroxy: 56.9 ng/mL (ref 30.0–100.0)

## 2023-10-29 DIAGNOSIS — M25852 Other specified joint disorders, left hip: Secondary | ICD-10-CM | POA: Diagnosis not present

## 2023-10-29 DIAGNOSIS — M25552 Pain in left hip: Secondary | ICD-10-CM | POA: Diagnosis not present

## 2023-11-02 ENCOUNTER — Ambulatory Visit (HOSPITAL_COMMUNITY): Payer: Self-pay | Admitting: Student

## 2023-11-05 DIAGNOSIS — M25852 Other specified joint disorders, left hip: Secondary | ICD-10-CM | POA: Diagnosis not present

## 2023-11-05 DIAGNOSIS — M25552 Pain in left hip: Secondary | ICD-10-CM | POA: Diagnosis not present

## 2023-11-16 DIAGNOSIS — E162 Hypoglycemia, unspecified: Secondary | ICD-10-CM | POA: Diagnosis not present

## 2023-11-16 DIAGNOSIS — M7652 Patellar tendinitis, left knee: Secondary | ICD-10-CM | POA: Diagnosis not present

## 2023-11-16 DIAGNOSIS — M5442 Lumbago with sciatica, left side: Secondary | ICD-10-CM | POA: Diagnosis not present

## 2023-11-16 DIAGNOSIS — M9903 Segmental and somatic dysfunction of lumbar region: Secondary | ICD-10-CM | POA: Diagnosis not present

## 2023-11-16 DIAGNOSIS — D696 Thrombocytopenia, unspecified: Secondary | ICD-10-CM | POA: Diagnosis not present

## 2023-11-16 DIAGNOSIS — M9905 Segmental and somatic dysfunction of pelvic region: Secondary | ICD-10-CM | POA: Diagnosis not present

## 2023-11-16 DIAGNOSIS — M9902 Segmental and somatic dysfunction of thoracic region: Secondary | ICD-10-CM | POA: Diagnosis not present

## 2023-11-17 DIAGNOSIS — M25552 Pain in left hip: Secondary | ICD-10-CM | POA: Diagnosis not present

## 2023-11-17 DIAGNOSIS — M25852 Other specified joint disorders, left hip: Secondary | ICD-10-CM | POA: Diagnosis not present

## 2023-11-24 DIAGNOSIS — M25852 Other specified joint disorders, left hip: Secondary | ICD-10-CM | POA: Diagnosis not present

## 2023-11-24 DIAGNOSIS — M25552 Pain in left hip: Secondary | ICD-10-CM | POA: Diagnosis not present

## 2023-11-27 ENCOUNTER — Encounter (HOSPITAL_COMMUNITY): Admitting: Student

## 2023-12-07 ENCOUNTER — Ambulatory Visit (INDEPENDENT_AMBULATORY_CARE_PROVIDER_SITE_OTHER): Admitting: Family Medicine

## 2023-12-07 ENCOUNTER — Encounter: Payer: Self-pay | Admitting: Family Medicine

## 2023-12-07 VITALS — BP 86/60 | Ht 69.0 in | Wt 138.0 lb

## 2023-12-07 DIAGNOSIS — M25552 Pain in left hip: Secondary | ICD-10-CM | POA: Diagnosis not present

## 2023-12-07 NOTE — Progress Notes (Signed)
 DATE OF VISIT: 12/07/2023        Kristine Graves DOB: Jun 17, 1985 MRN: [id number]  CC:  f/u Lt hip pain  History of present Illness: Kristine Graves is a 38 y.o. 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 - was referred to PT Visit she reports her symptoms are change Continues to have pain in the left anterior hip/groin Has completed 4-5 sessions of PT and has been doing her home exercise program regularly over the past 6 to 7 weeks Has not been taking any over-the-counter medications on a regular basis, just occasional Tylenol  or Advil  as needed Denies any radiation down the leg Some occasional associated back tightness  Medications:  Outpatient Encounter Medications as of 12/07/2023  Medication Sig   acetaminophen  (TYLENOL ) 500 MG tablet You can take 1000 mg of Tylenol /acetaminophen  every 8 hours as needed for pain.  For the next 24-48hrs we recommend you just take it on a schedule.  As pain improves you can go back to using it as needed.  You can buy this over-the-counter at any drugstore.  Do not exceed 4000 mg of Tylenol  per day it can harm your liver.   busPIRone  (BUSPAR ) 15 MG tablet Take 1 tablet (15 mg total) by mouth 2 (two) times daily.   DULoxetine  (CYMBALTA ) 60 MG capsule Take 1 capsule (60 mg total) by mouth daily.   ibuprofen  (ADVIL ) 200 MG tablet You can take 2 to 3 tablets every 6 hours as needed for pain.  This is your second pain control medication.  You can alternate this with plain Tylenol /acetaminophen .  You can buy this over-the-counter at any drugstore.   L-Methylfolate 15 MG TABS Take 1 tablet (15 mg total) by mouth daily.   Multiple Vitamins-Minerals (MULTIVITAMIN WITH MINERALS) tablet Take 1 tablet by mouth daily.   naltrexone  (DEPADE) 50 MG tablet Take 1 tablet (50 mg total) by mouth at bedtime.   traZODone  (DESYREL ) 50 MG tablet Take 0.5-1 tablets (25-50 mg total) by mouth at  bedtime as needed for sleep.   No facility-administered encounter medications on file as of 12/07/2023.    Allergies: has no known allergies.  Physical Examination: Vitals: BP (!) 86/60   Ht 5' 9 (1.753 m)   Wt 138 lb (62.6 kg)   BMI 20.38 kg/m  GENERAL:  Kristine Graves is a 38 y.o. female appearing their stated age, alert and oriented x 3, in no apparent distress.  SKIN: no rashes or lesions, skin clean, dry, intact MSK: Hip: Left hip with full range of motion with pain at terminal flexion and internal rotation.  Positive FADIR, negative FABER.  Mild tenderness palpation of the anterior hip, minimal tenderness over the greater trochanter laterally.  Hip strength 5 -/5 throughout, increased pain with resisted hip flexion. Right hip with full range of motion without pain, weakness, instability.  Negative FABER and FADIR.  Hip strength 5/5 throughout. L-spine: No midline or paraspinal tenderness.  Good range of motion without pain.  Negative straight leg raise bilaterally NEURO: sensation intact to light touch lower extremity bilaterally VASC: pulses 2+ and symmetric lower extremity bilaterally, no edema  Radiology: L-spine XR 10/22/23 personally reviewed and interpreted by me today showing: -No acute bony abnormalities, IUD is in place  Left hip and pelvis x-ray 10/22/23 personally reviewed interpreted by me today showing: - Cystic changes along the femoral head-neck junctions of bilateral hips, slightly more prominent  on the right compared to the left. 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  X-rays suspicious for possible FAI, differential also to include labral tear  Plan: - X-rays reviewed as noted above - Will proceed with MRI arthrogram of the left hip to rule out labral  tear, FAI, other abnormality.  She has not responded to conservative therapy with 6+ weeks of PT and home exercise program, 6+ weeks of oral NSAIDs and Tylenol .  She would be interested in surgical intervention if indicated - She will continue her home exercises interim - Follow-up with me 1 week after the MRI to discuss results and further treatment   Patient expressed understanding & agreement with above.  Encounter Diagnosis  Name Primary?   Left hip pain Yes    Orders Placed This Encounter  Procedures   MR HIP LEFT W CONTRAST   DG FLUORO GUIDED NEEDLE PLC ASPIRATION/INJECTION LOC

## 2023-12-10 NOTE — Progress Notes (Unsigned)
 Psychiatric Follow Up Adult Assessment  Patient Identification: Kristine Graves MRN:  [id number] Date of Evaluation:  12/11/2023  Assessment:  Kristine Graves is a 38 y.o. female with a history of generalized anxiety disorder, alcohol use disorder who presents in person to [facility] as a follow up for medication management.  Based on patient's history and assessment on initial visit, her symptoms were consistent with alcohol use disorder in early remission, major depressive disorder, alcohol-induced mood disorder, and generalized anxiety disorder.    Patient remains abstinent from alcohol and reports relative stability in mood and anxiety. She continues to take her medications as prescribed and has been able to maintain compliance with daily medication regiment.  She continues to feel that her medications are helpful in management of her depression and anxiety. She denies alcohol craving and continues to feel that she has a strong social support through family, AA, therapist, and sponsor. She continues to see her therapist weekly who is a substance use counselor. Plan for her to continue psychotropic regiment at this time and follow up in one month to identify if there are any additional titration of medications required. Patient will get updated labs today.  Plan:  # Alcohol Use Disorder, in early remission Past medication trials:  Interventions: -- Last Use: December 2024 -- Continue abstaining from alcohol -- continue naltrexone  50 mg at bedtime  -ordered labs: CBC, CMP, vitamin D , and TSH   # Major Depressive Disorder, in remission # History of Postpartum Depression # History of alcohol induced mood disorder # Generalized Anxiety Disorder Past medication trials:  Status of problem: remission Interventions: -- continue duloxetine  60 mg daily  -- Continue buspirone  to 15 bid -- continue trazodone  25-50 mg at bedtime prn  # MTHFR gene variant Past  medication trials:  Interventions: -- continue MVI  Hx of macrocytic anemia --continue MVI supplementation  Return to care in 4 weeks  Patient was given contact information for behavioral health clinic and was instructed to call 911 for emergencies.    Patient and plan of care will be discussed with the Attending MD, Dr. Carvin, who agrees with the above statement and plan.   Subjective:  Chief Complaint: Medication Management  Interval History: Patient presents in person for follow up. She denies SI/HI/AVH.  She reports eating and sleeping well.  She reports her mood has been stable.  She was able to handle being around people who are drinking alcohol and managing environments that she had previously drank heavily (such as airport and hotel room). She continues to go to AA and even went to an AA meeting at her conference she went to. Patient amenable to continuing psychotropics as prescribed and following up with me in ~1 month.  Past Psychiatric History:  Diagnoses: MDD, GAD, alcohol use disorder Medication trials: auvelity (ineffective), lorazepam  (sedating), lexapro, mirtazapine, bupropion Previous psychiatrist/therapist: Dr. Karolynn [facility] Hospitalizations: denies Suicide attempts: denies SIB: denies Hx of violence towards others: denies Current access to guns: denies  Substance Abuse History in the last 12 months:  Yes.  Attended [facility] residential rehab in December 2024 and [facility] January 2025.  Past Medical History:  Past Medical History:  Diagnosis Date   Abnormal maternal serum screening test 06/07/2015   1:39 Down syndrome risk from first trimester screening- NIPS drawn 06/07/15     Acute appendicitis 06/09/2019   Anxiety    Blood transfusion without reported diagnosis    at birth RH incompatibility   Depression  Elevated liver enzymes 11/30/2021   High risk pregnancy with low PAPPA 06/07/2015   History of appendectomy 11/30/2021   Hypercalcemia  11/30/2021   Hyponatremia 11/30/2021   Indication for care in labor or delivery 07/13/2018   Metabolic acidosis, increased anion gap 11/30/2021   Polycythemia 11/30/2021   SBO (small bowel obstruction) (HCC) 11/30/2021   SVD (spontaneous vaginal delivery) 07/13/2018    Past Surgical History:  Procedure Laterality Date   BREAST ENHANCEMENT SURGERY     LAPAROSCOPIC APPENDECTOMY N/A 06/09/2019   Procedure: APPENDECTOMY LAPAROSCOPIC;  Surgeon: Tanda Locus, MD;  Location: [facility] OR;  Service: General;  Laterality: N/A;   NASAL SINUS SURGERY       Family History:  Family History  Problem Relation Age of Onset   Hypertension Father    Graves' disease Maternal Grandmother    Diabetes Maternal Grandfather    Heart attack Paternal Grandfather    Diabetes Paternal Grandfather     Social History:   Academic/Vocational: full time employment Social History   Socioeconomic History   Marital status: Married    Spouse name: Not on file   Number of children: Not on file   Years of education: Not on file   Highest education level: Not on file  Occupational History   Not on file  Tobacco Use   Smoking status: Never   Smokeless tobacco: Never  Vaping Use   Vaping status: Never Used  Substance and Sexual Activity   Alcohol use: Not Currently   Drug use: Never   Sexual activity: Not on file  Other Topics Concern   Not on file  Social History Narrative   Not on file   Social Drivers of Health   Financial Resource Strain: Low Risk  (07/12/2018)   Overall Financial Resource Strain (CARDIA)    Difficulty of Paying Living Expenses: Not hard at all  Food Insecurity: No Food Insecurity (07/12/2018)   Hunger Vital Sign    Worried About Running Out of Food in the Last Year: Never true    Ran Out of Food in the Last Year: Never true  Transportation Needs: Unknown (07/12/2018)   PRAPARE - Administrator, Civil Service (Medical): No    Lack of Transportation (Non-Medical): Not on file   Physical Activity: Not on file  Stress: No Stress Concern Present (07/12/2018)   [employer] of Occupational Health - Occupational Stress Questionnaire    Feeling of Stress : Only a little  Social Connections: Not on file    Additional Social History: updated  Allergies:  No Known Allergies  Current Medications: Current Outpatient Medications  Medication Sig Dispense Refill   acetaminophen  (TYLENOL ) 500 MG tablet You can take 1000 mg of Tylenol /acetaminophen  every 8 hours as needed for pain.  For the next 24-48hrs we recommend you just take it on a schedule.  As pain improves you can go back to using it as needed.  You can buy this over-the-counter at any drugstore.  Do not exceed 4000 mg of Tylenol  per day it can harm your liver. 30 tablet 0   busPIRone  (BUSPAR ) 15 MG tablet Take 1 tablet (15 mg total) by mouth 2 (two) times daily. 180 tablet 0   DULoxetine  (CYMBALTA ) 60 MG capsule Take 1 capsule (60 mg total) by mouth daily. 90 capsule 0   ibuprofen  (ADVIL ) 200 MG tablet You can take 2 to 3 tablets every 6 hours as needed for pain.  This is your second pain control medication.  You  can alternate this with plain Tylenol /acetaminophen .  You can buy this over-the-counter at any drugstore.     L-Methylfolate 15 MG TABS Take 1 tablet (15 mg total) by mouth daily. 90 tablet 0   Multiple Vitamins-Minerals (MULTIVITAMIN WITH MINERALS) tablet Take 1 tablet by mouth daily.     naltrexone  (DEPADE) 50 MG tablet Take 1 tablet (50 mg total) by mouth at bedtime. 90 tablet 0   traZODone  (DESYREL ) 50 MG tablet Take 0.5-1 tablets (25-50 mg total) by mouth at bedtime as needed for sleep. 90 tablet 0   No current facility-administered medications for this visit.    ROS: Review of Systems   Objective:  Psychiatric Specialty Exam:  unknown if currently breastfeeding.There is no height or weight on file to calculate BMI.  General Appearance: Well Groomed  Eye Contact:  Good  Speech:  Clear and  Coherent and Normal Rate  Volume:  Normal  Mood:  Euthymic  Affect:  Appropriate and Congruent  Thought Content: Logical   Suicidal Thoughts:  No  Homicidal Thoughts:  No  Thought Process:  Coherent, Goal Directed, and Linear  Orientation:  Full (Time, Place, and Person)  Judgment:  Intact  Insight:  Good  Concentration:  Concentration: Good  Fund of Knowledge: Fair  Language: Fair  Psychomotor Activity:  Normal  Akathisia:  No  AIMS (if indicated): not done  Assets:  Communication Skills Desire for Improvement Financial Resources/Insurance Housing Intimacy Leisure Time Physical Health Resilience Social Support Talents/Skills Transportation Vocational/Educational  ADL's:  Intact  Cognition: WNL      PE: General: well-appearing; no acute distress  Pulm: no increased work of breathing on room air  Strength & Muscle Tone: within normal limits Neuro: no focal neurological deficits observed  Gait & Station: normal   Screenings:  PHQ2-9    Flowsheet Row US  MFM OB FOLLOW UP from 11[facility]02/[facility]  PHQ-2 Total Score 0   Flowsheet Row ED from 11/30/2021 in [facility]  C-SSRS RISK CATEGORY No Risk     Prentice Espy, MD 7/31/20258:20 PM

## 2023-12-11 ENCOUNTER — Ambulatory Visit (INDEPENDENT_AMBULATORY_CARE_PROVIDER_SITE_OTHER): Admitting: Student

## 2023-12-11 DIAGNOSIS — F411 Generalized anxiety disorder: Secondary | ICD-10-CM | POA: Diagnosis not present

## 2023-12-11 DIAGNOSIS — F1021 Alcohol dependence, in remission: Secondary | ICD-10-CM | POA: Diagnosis not present

## 2023-12-11 DIAGNOSIS — F3340 Major depressive disorder, recurrent, in remission, unspecified: Secondary | ICD-10-CM

## 2023-12-11 MED ORDER — DULOXETINE HCL 60 MG PO CPEP: 60.0000 mg | ORAL_CAPSULE | Freq: Every day | ORAL | 0 refills | Status: DC

## 2023-12-11 MED ORDER — TRAZODONE HCL 50 MG PO TABS: 25.0000 mg | ORAL_TABLET | Freq: Every evening | ORAL | 0 refills | Status: DC | PRN

## 2023-12-11 MED ORDER — BUSPIRONE HCL 15 MG PO TABS: 15.0000 mg | ORAL_TABLET | Freq: Two times a day (BID) | ORAL | 0 refills | Status: DC

## 2023-12-11 MED ORDER — NALTREXONE HCL 50 MG PO TABS
50.0000 mg | ORAL_TABLET | Freq: Every day | ORAL | 0 refills | Status: AC
Start: 2024-01-04 — End: 2024-04-03

## 2023-12-22 ENCOUNTER — Ambulatory Visit
Admission: RE | Admit: 2023-12-22 | Discharge: 2023-12-22 | Disposition: A | Discharge status: Home / Self Care | Source: Ambulatory Visit | Attending: Family Medicine | Admitting: Family Medicine

## 2023-12-22 DIAGNOSIS — M24852 Other specific joint derangements of left hip, not elsewhere classified: Secondary | ICD-10-CM | POA: Diagnosis not present

## 2023-12-22 DIAGNOSIS — M25552 Pain in left hip: Secondary | ICD-10-CM

## 2023-12-22 DIAGNOSIS — D696 Thrombocytopenia, unspecified: Secondary | ICD-10-CM | POA: Diagnosis not present

## 2023-12-22 DIAGNOSIS — E162 Hypoglycemia, unspecified: Secondary | ICD-10-CM | POA: Diagnosis not present

## 2023-12-22 MED ORDER — IOPAMIDOL (ISOVUE-M 200) INJECTION 41%
10.0000 mL | Freq: Once | INTRAMUSCULAR | Status: AC
  Administered 2023-12-22 (×2): 10 mL via INTRA_ARTICULAR

## 2023-12-22 NOTE — Procedures (Signed)
 Radiology Procedure Note  Risks and benefits of joint injection were discussed with the patient including, but not limited to bleeding, infection, damage to adjacent structures, allergic reaction, and extraarticular contrast injection.    All of the patient's questions were answered, patient is agreeable to proceed. Consent signed and in chart.  A timeout was performed with all members of the team prior to start of the procedure. Correct patient and correct procedure was confirmed. Allergies were reviewed.   PROCEDURE SUMMARY:  Successful fluoro guided left hip arthrogram. No immediate complications.  Patient tolerated well.   EBL = trace  Please see full dictation in imaging section of Epic for procedure details.   Rayson Rando H Mrk Buzby PA-C 12/22/2023 3:44 PM

## 2023-12-24 DIAGNOSIS — M9902 Segmental and somatic dysfunction of thoracic region: Secondary | ICD-10-CM | POA: Diagnosis not present

## 2023-12-24 DIAGNOSIS — M9903 Segmental and somatic dysfunction of lumbar region: Secondary | ICD-10-CM | POA: Diagnosis not present

## 2023-12-24 DIAGNOSIS — M7652 Patellar tendinitis, left knee: Secondary | ICD-10-CM | POA: Diagnosis not present

## 2023-12-24 DIAGNOSIS — M9905 Segmental and somatic dysfunction of pelvic region: Secondary | ICD-10-CM | POA: Diagnosis not present

## 2023-12-24 DIAGNOSIS — M5442 Lumbago with sciatica, left side: Secondary | ICD-10-CM | POA: Diagnosis not present

## 2023-12-31 ENCOUNTER — Telehealth: Admitting: Family Medicine

## 2023-12-31 ENCOUNTER — Encounter: Payer: Self-pay | Admitting: Family Medicine

## 2023-12-31 DIAGNOSIS — S73192D Other sprain of left hip, subsequent encounter: Secondary | ICD-10-CM

## 2023-12-31 NOTE — Progress Notes (Signed)
 Virtual Visit via Video Note  I connected with Kristine Graves on 12/31/23 at 10:45 AM EDT by a video enabled telemedicine application and verified that I am speaking with the correct person using two identifiers.  Location: Patient: home Provider: office   I discussed the limitations of evaluation and management by telemedicine and the availability of in person appointments. The patient expressed understanding and agreed to proceed.  DATE OF VISIT: 12/31/2023        Kristine Graves DOB: [birth year] MRN: [id number]  CC:  f/u MRI results  History of present Illness: Kristine Graves is a 38 y.o. female who presents for a follow-up visit for MRI results Last seen by me 12/07/23 No change in symptoms since last visit  Underwent MRI arthrogram 12/22/23 showing: IMPRESSION: - Slight prominence at the anterior head neck junction without secondary signs of femoroacetabular impingement. No evidence of a labral tear. Likely benign superior labral sulcus. Mild irregularity of the anterior labrum best seen on axial image 12. This could reflect a subtle anterior superior labral tear in the appropriate clinical setting. No displaced tear is present. Correlation for anterior superior labral symptoms. - Mild insertional tendinosis of the gluteus medius and minimus tendons.  States may have had minimal improvement of symptoms following arthrogram.  Feels that the injection was in the area of her symptoms.  Medications:  Outpatient Encounter Medications as of 12/31/2023  Medication Sig   acetaminophen  (TYLENOL ) 500 MG tablet You can take 1000 mg of Tylenol /acetaminophen  every 8 hours as needed for pain.  For the next 24-48hrs we recommend you just take it on a schedule.  As pain improves you can go back to using it as needed.  You can buy this over-the-counter at any drugstore.  Do not exceed 4000 mg of Tylenol  per day it can harm your liver.   [START ON 01/04/2024] busPIRone  (BUSPAR ) 15 MG tablet  Take 1 tablet (15 mg total) by mouth 2 (two) times daily.   [START ON 01/04/2024] DULoxetine  (CYMBALTA ) 60 MG capsule Take 1 capsule (60 mg total) by mouth daily.   ibuprofen  (ADVIL ) 200 MG tablet You can take 2 to 3 tablets every 6 hours as needed for pain.  This is your second pain control medication.  You can alternate this with plain Tylenol /acetaminophen .  You can buy this over-the-counter at any drugstore.   L-Methylfolate 15 MG TABS Take 1 tablet (15 mg total) by mouth daily.   Multiple Vitamins-Minerals (MULTIVITAMIN WITH MINERALS) tablet Take 1 tablet by mouth daily.   [START ON 01/04/2024] naltrexone  (DEPADE) 50 MG tablet Take 1 tablet (50 mg total) by mouth at bedtime.   [START ON 01/04/2024] traZODone  (DESYREL ) 50 MG tablet Take 0.5-1 tablets (25-50 mg total) by mouth at bedtime as needed for sleep.   No facility-administered encounter medications on file as of 12/31/2023.    Allergies: has no known allergies.  Physical Examination: Vitals: There were no vitals taken for this visit. GENERAL:  Kristine Graves is a 38 y.o. female appearing their stated age, alert and oriented x 3, in no apparent distress.  Remainder of exam limited due to video visit  Radiology: MRI arthrogram LT HIP 12/22/23 showing: IMPRESSION: - Slight prominence at the anterior head neck junction without secondary signs of femoroacetabular impingement. No evidence of a labral tear. Likely benign superior labral sulcus. Mild irregularity of the anterior labrum best seen on axial image 12. This could reflect a subtle anterior superior labral tear in the appropriate  clinical setting. No displaced tear is present. Correlation for anterior superior labral symptoms. - Mild insertional tendinosis of the gluteus medius and minimus tendons.  Assessment & Plan Tear of left acetabular labrum, subsequent encounter Ongoing left hip pain for over 6 months, no improvement with conservative interventions including 6+ weeks of  physical therapy, home exercise program.  Arthrogram completed 12/22/2023 suspicious of possible labral tear.  History and exam does seem consistent with labral tear  Plan: - MRI results reviewed.  Diagnosis and treatment reviewed - Given ongoing symptoms, her age, recommend evaluation with surgeon.  Referral to Dr. Genelle with [facility] is for consideration of hip arthroscopy - Did discuss possible consideration of ultrasound-guided cortisone injection.  Advised that this would likely be a temporizing measure.  Recommended discussing with surgeon prior to proceeding with this - She will follow-up with me on an as-needed basis pending evaluation with surgeon.  Reach out sooner with any questions or concerns.   Patient expressed understanding & agreement with above.  Encounter Diagnosis  Name Primary?   Tear of left acetabular labrum, subsequent encounter Yes    Orders Placed This Encounter  Procedures   Ambulatory referral to Orthopedic Surgery       I discussed the assessment and treatment plan with the patient. The patient was provided an opportunity to ask questions and all were answered. The patient agreed with the plan and demonstrated an understanding of the instructions.   The patient was advised to call back or seek an in-person evaluation if the symptoms worsen or if the condition fails to improve as anticipated.  I provided 10 minutes of non-face-to-face time during this encounter.   Kristine JAYSON Cedar, DO

## 2024-01-21 DIAGNOSIS — M9903 Segmental and somatic dysfunction of lumbar region: Secondary | ICD-10-CM | POA: Diagnosis not present

## 2024-01-21 DIAGNOSIS — M9905 Segmental and somatic dysfunction of pelvic region: Secondary | ICD-10-CM | POA: Diagnosis not present

## 2024-01-21 DIAGNOSIS — M5442 Lumbago with sciatica, left side: Secondary | ICD-10-CM | POA: Diagnosis not present

## 2024-01-21 DIAGNOSIS — M9902 Segmental and somatic dysfunction of thoracic region: Secondary | ICD-10-CM | POA: Diagnosis not present

## 2024-01-21 DIAGNOSIS — M7652 Patellar tendinitis, left knee: Secondary | ICD-10-CM | POA: Diagnosis not present

## 2024-01-27 ENCOUNTER — Ambulatory Visit (HOSPITAL_BASED_OUTPATIENT_CLINIC_OR_DEPARTMENT_OTHER): Admitting: Orthopaedic Surgery

## 2024-01-27 ENCOUNTER — Ambulatory Visit (HOSPITAL_BASED_OUTPATIENT_CLINIC_OR_DEPARTMENT_OTHER): Payer: Self-pay | Admitting: Orthopaedic Surgery

## 2024-01-27 ENCOUNTER — Other Ambulatory Visit (HOSPITAL_BASED_OUTPATIENT_CLINIC_OR_DEPARTMENT_OTHER): Payer: Self-pay

## 2024-01-27 ENCOUNTER — Encounter (HOSPITAL_BASED_OUTPATIENT_CLINIC_OR_DEPARTMENT_OTHER): Payer: Self-pay

## 2024-01-27 DIAGNOSIS — S73192A Other sprain of left hip, initial encounter: Secondary | ICD-10-CM | POA: Diagnosis not present

## 2024-01-27 MED ORDER — OXYCODONE HCL 5 MG PO TABS
5.0000 mg | ORAL_TABLET | ORAL | 0 refills | Status: AC | PRN
  Filled 2024-01-27: qty 10, 2d supply, fill #0

## 2024-01-27 MED ORDER — ASPIRIN 325 MG PO TBEC
325.0000 mg | DELAYED_RELEASE_TABLET | Freq: Every day | ORAL | 0 refills | Status: AC
  Filled 2024-01-27: qty 14, 14d supply, fill #0

## 2024-01-27 MED ORDER — IBUPROFEN 800 MG PO TABS
800.0000 mg | ORAL_TABLET | Freq: Three times a day (TID) | ORAL | 0 refills | Status: AC
  Filled 2024-01-27: qty 30, 10d supply, fill #0

## 2024-01-27 MED ORDER — ACETAMINOPHEN 500 MG PO TABS
500.0000 mg | ORAL_TABLET | Freq: Three times a day (TID) | ORAL | 0 refills | Status: AC
  Filled 2024-01-27: qty 30, 10d supply, fill #0

## 2024-01-27 NOTE — Progress Notes (Signed)
 Chief Complaint: Left hip pain     History of Present Illness:    Kristine Graves is a 38 y.o. female presents with ongoing left hip pain for the last several years.  She has been working with physical therapy.  She works for the [employer].  She is very active including doing barre which has been completely limited as a result of her hip pain.  She does experience the pain in the groin.  This does wraparound in a C distribution.  She has been working on strengthening as well as physical therapy without any relief    PMH/PSH/Family History/Social History/Meds/Allergies:    Past Medical History:  Diagnosis Date   Abnormal maternal serum screening test 06/07/2015   1:39 Down syndrome risk from first trimester screening- NIPS drawn 06/07/15     Acute appendicitis 06/09/2019   Anxiety    Blood transfusion without reported diagnosis    at birth RH incompatibility   Depression    Elevated liver enzymes 11/30/2021   High risk pregnancy with low PAPPA 06/07/2015   History of appendectomy 11/30/2021   Hypercalcemia 11/30/2021   Hyponatremia 11/30/2021   Indication for care in labor or delivery 07/13/2018   Metabolic acidosis, increased anion gap 11/30/2021   Polycythemia 11/30/2021   SBO (small bowel obstruction) (HCC) 11/30/2021   SVD (spontaneous vaginal delivery) 07/13/2018   Past Surgical History:  Procedure Laterality Date   BREAST ENHANCEMENT SURGERY     LAPAROSCOPIC APPENDECTOMY N/A 06/09/2019   Procedure: APPENDECTOMY LAPAROSCOPIC;  Surgeon: Tanda Locus, MD;  Location: [facility];  Service: General;  Laterality: N/A;   NASAL SINUS SURGERY     Social History   Socioeconomic History   Marital status: Married    Spouse name: Not on file   Number of children: Not on file   Years of education: Not on file   Highest education level: Not on file  Occupational History   Not on file  Tobacco Use   Smoking status: Never   Smokeless tobacco:  Never  Vaping Use   Vaping status: Never Used  Substance and Sexual Activity   Alcohol use: Not Currently   Drug use: Never   Sexual activity: Not on file  Other Topics Concern   Not on file  Social History Narrative   Not on file   Social Drivers of Health   Financial Resource Strain: Low Risk  (07/12/2018)   Overall Financial Resource Strain (CARDIA)    Difficulty of Paying Living Expenses: Not hard at all  Food Insecurity: No Food Insecurity (07/12/2018)   Hunger Vital Sign    Worried About Running Out of Food in the Last Year: Never true    Ran Out of Food in the Last Year: Never true  Transportation Needs: Unknown (07/12/2018)   PRAPARE - Administrator, Civil Service (Medical): No    Lack of Transportation (Non-Medical): Not on file  Physical Activity: Not on file  Stress: No Stress Concern Present (07/12/2018)   [employer] of Occupational Health - Occupational Stress Questionnaire    Feeling of Stress : Only a little  Social Connections: Not on file   Family History  Problem Relation Age of Onset   Hypertension Father    Graves' disease Maternal Grandmother    Diabetes Maternal Grandfather    Heart attack Paternal Grandfather    Diabetes Paternal Grandfather    No Known Allergies Current Outpatient Medications  Medication Sig Dispense Refill  acetaminophen  (TYLENOL ) 500 MG tablet Take 1 tablet (500 mg total) by mouth every 8 (eight) hours for 10 days. 30 tablet 0   aspirin  EC 325 MG tablet Take 1 tablet (325 mg total) by mouth daily. 14 tablet 0   ibuprofen  (ADVIL ) 800 MG tablet Take 1 tablet (800 mg total) by mouth every 8 (eight) hours for 10 days. Please take with food, please alternate with acetaminophen  30 tablet 0   oxyCODONE  (ROXICODONE ) 5 MG immediate release tablet Take 1 tablet (5 mg total) by mouth every 4 (four) hours as needed for severe pain (pain score 7-10) or breakthrough pain. 10 tablet 0   acetaminophen  (TYLENOL ) 500 MG tablet You can  take 1000 mg of Tylenol /acetaminophen  every 8 hours as needed for pain.  For the next 24-48hrs we recommend you just take it on a schedule.  As pain improves you can go back to using it as needed.  You can buy this over-the-counter at any drugstore.  Do not exceed 4000 mg of Tylenol  per day it can harm your liver. 30 tablet 0   busPIRone  (BUSPAR ) 15 MG tablet Take 1 tablet (15 mg total) by mouth 2 (two) times daily. 180 tablet 0   DULoxetine  (CYMBALTA ) 60 MG capsule Take 1 capsule (60 mg total) by mouth daily. 90 capsule 0   ibuprofen  (ADVIL ) 200 MG tablet You can take 2 to 3 tablets every 6 hours as needed for pain.  This is your second pain control medication.  You can alternate this with plain Tylenol /acetaminophen .  You can buy this over-the-counter at any drugstore.     L-Methylfolate 15 MG TABS Take 1 tablet (15 mg total) by mouth daily. 90 tablet 0   Multiple Vitamins-Minerals (MULTIVITAMIN WITH MINERALS) tablet Take 1 tablet by mouth daily.     naltrexone  (DEPADE) 50 MG tablet Take 1 tablet (50 mg total) by mouth at bedtime. 90 tablet 0   traZODone  (DESYREL ) 50 MG tablet Take 0.5-1 tablets (25-50 mg total) by mouth at bedtime as needed for sleep. 90 tablet 0   No current facility-administered medications for this visit.   No results found.  Review of Systems:   A ROS was performed including pertinent positives and negatives as documented in the HPI.  Physical Exam :   Constitutional: NAD and appears stated age Neurological: Alert and oriented Psych: Appropriate affect and cooperative unknown if currently breastfeeding.   Comprehensive Musculoskeletal Exam:    Left hip with positive FADIR maneuver.  90 degrees and 30 degrees internal rotation causes pain.  External rotation improves this.  She can externally rotate 50 degrees without pain.  Good abduction strength.  Nonantalgic gait distal neurosensory exam is intact   Imaging:   Xray (3 views left hip): 72% acetabular coverage  index with tonus grade 0 changes  MRI (left hip): Anterior superior labral tear   I personally reviewed and interpreted the radiographs.   Assessment and Plan:   39 y.o. female with left hip pain and setting of left hip instability and acetabular undercoverage.  I did describe that at this time she has trialed physical therapy and strengthening without any relief.  Given this I did discuss alternatives including left hip arthroscopy with labral repair.  I did discuss the risks and benefits as well as associated recovery timeframe.  After discussion she would like to proceed  -Plan for left hip arthroscopy with labral repair   After a lengthy discussion of treatment options, including risks, benefits, alternatives, complications of  surgical and nonsurgical conservative options, the patient elected surgical repair.   The patient  is aware of the material risks  and complications including, but not limited to injury to adjacent structures, neurovascular injury, infection, numbness, bleeding, implant failure, thermal burns, stiffness, persistent pain, failure to heal, disease transmission from allograft, need for further surgery, dislocation, anesthetic risks, blood clots, risks of death,and others. The probabilities of surgical success and failure discussed with patient given their particular co-morbidities.The time and nature of expected rehabilitation and recovery was discussed.The patient's questions were all answered preoperatively.  No barriers to understanding were noted. I explained the natural history of the disease process and Rx rationale.  I explained to the patient what I considered to be reasonable expectations given their personal situation.  The final treatment plan was arrived at through a shared patient decision making process model.    I personally saw and evaluated the patient, and participated in the management and treatment plan.  Elspeth Parker, MD Attending Physician,  Orthopedic Surgery  This document was dictated using Dragon voice recognition software. A reasonable attempt at proof reading has been made to minimize errors.

## 2024-02-15 DIAGNOSIS — L209 Atopic dermatitis, unspecified: Secondary | ICD-10-CM | POA: Diagnosis not present

## 2024-02-15 DIAGNOSIS — Z23 Encounter for immunization: Secondary | ICD-10-CM | POA: Diagnosis not present

## 2024-02-22 ENCOUNTER — Telehealth (HOSPITAL_BASED_OUTPATIENT_CLINIC_OR_DEPARTMENT_OTHER): Payer: Self-pay | Admitting: Orthopaedic Surgery

## 2024-02-22 NOTE — Telephone Encounter (Signed)
 Patient insurance denied her surgery and wants to know if there is anything she can do to change this? [phone]

## 2024-02-26 ENCOUNTER — Ambulatory Visit (INDEPENDENT_AMBULATORY_CARE_PROVIDER_SITE_OTHER): Admitting: Student

## 2024-02-26 DIAGNOSIS — F1021 Alcohol dependence, in remission: Secondary | ICD-10-CM | POA: Diagnosis not present

## 2024-02-26 DIAGNOSIS — F411 Generalized anxiety disorder: Secondary | ICD-10-CM | POA: Diagnosis not present

## 2024-02-26 DIAGNOSIS — F3340 Major depressive disorder, recurrent, in remission, unspecified: Secondary | ICD-10-CM

## 2024-02-26 MED ORDER — BUSPIRONE HCL 15 MG PO TABS: 15.0000 mg | ORAL_TABLET | Freq: Two times a day (BID) | ORAL | 0 refills | Status: DC

## 2024-02-26 MED ORDER — DULOXETINE HCL 60 MG PO CPEP: 60.0000 mg | ORAL_CAPSULE | Freq: Every day | ORAL | 0 refills | Status: DC

## 2024-02-26 MED ORDER — TRAZODONE HCL 50 MG PO TABS: 25.0000 mg | ORAL_TABLET | Freq: Every evening | ORAL | 0 refills | Status: DC | PRN

## 2024-02-26 MED ORDER — NALTREXONE HCL 50 MG PO TABS: 50.0000 mg | ORAL_TABLET | Freq: Every day | ORAL | 0 refills | Status: DC

## 2024-02-26 NOTE — Progress Notes (Unsigned)
 Psychiatric Follow Up Adult Assessment  Patient Identification: Kristine Graves MRN:  [id number] Date of Evaluation:  02/26/2024  Assessment:  Kristine Graves is a 38 y.o. female with a history of generalized anxiety disorder, alcohol use disorder who presents in person to [facility] as a follow up for medication management.  Based on patient's history and assessment on initial visit, her symptoms were consistent with alcohol use disorder in early remission, major depressive disorder, alcohol-induced mood disorder, and generalized anxiety disorder.    Patient remains abstinent from alcohol and reports continued stability in mood and anxiety. She continues to take her medications as prescribed and has been able to maintain compliance with daily medication regiment. She continues to feel that her medications are helpful in management of her depression and anxiety. She denies alcohol craving and continues to feel that she has a strong social support through family, AA, therapist, and sponsor. Plan to have patient follow up in ~8 weeks to identify if there are any additional titration of medications required. Discussed looking into tapering some of psychotropics as December would be 1 year of remission from alcohol use disorder.   Plan:  # Alcohol Use Disorder, in early remission Past medication trials:  Interventions: -- Last Use: December 2024 -- Continue abstaining from alcohol -- continue naltrexone  50 mg at bedtime  # Major Depressive Disorder, in remission # History of Postpartum Depression # History of alcohol induced mood disorder # Generalized Anxiety Disorder Past medication trials:  Status of problem: remission Interventions: -- continue duloxetine  60 mg daily  -- Continue buspirone  to 15 bid -- continue trazodone  25-50 mg at bedtime prn  # MTHFR gene variant Past medication trials:  Interventions: -- continue MVI  Hx of macrocytic  anemia --continue MVI supplementation  Return to care in 4 weeks  Patient was given contact information for behavioral health clinic and was instructed to call 911 for emergencies.    Patient and plan of care will be discussed with the Attending MD,who agrees with the above statement and plan.   Subjective:  Chief Complaint: Medication Management  Interval History: Patient presents in person for follow up. She denies SI/HI/AVH.  She reports eating and sleeping well. She reports mood has been good and alcohol use disorder has been well-controlled. No acute complaints with current psychotropics. Patient feels medications are appropriate and helpful for her. She inquired regarding tapering psychotropics next visit since it will have been 1 years since her last alcoholic drink.   Past Psychiatric History:  Diagnoses: MDD, GAD, alcohol use disorder Medication trials: auvelity (ineffective), lorazepam  (sedating), lexapro, mirtazapine, bupropion Previous psychiatrist/therapist: Dr. Karolynn [facility] Hospitalizations: denies Suicide attempts: denies SIB: denies Hx of violence towards others: denies Current access to guns: denies  Substance Abuse History in the last 12 months:  Yes.  Attended [facility] in December 2024 and [facility] January 2025.  Past Medical History:  Past Medical History:  Diagnosis Date   Abnormal maternal serum screening test 06/07/2015   1:39 Down syndrome risk from first trimester screening- NIPS drawn 06/07/15     Acute appendicitis 06/09/2019   Anxiety    Blood transfusion without reported diagnosis    at birth RH incompatibility   Depression    Elevated liver enzymes 11/30/2021   High risk pregnancy with low PAPPA 06/07/2015   History of appendectomy 11/30/2021   Hypercalcemia 11/30/2021   Hyponatremia 11/30/2021   Indication for care in labor or delivery 07/13/2018   Metabolic acidosis,  increased anion gap 11/30/2021   Polycythemia  11/30/2021   SBO (small bowel obstruction) (HCC) 11/30/2021   SVD (spontaneous vaginal delivery) 07/13/2018    Past Surgical History:  Procedure Laterality Date   BREAST ENHANCEMENT SURGERY     LAPAROSCOPIC APPENDECTOMY N/A 06/09/2019   Procedure: APPENDECTOMY LAPAROSCOPIC;  Surgeon: Tanda Locus, MD;  Location: [facility] OR;  Service: General;  Laterality: N/A;   NASAL SINUS SURGERY       Family History:  Family History  Problem Relation Age of Onset   Hypertension Father    Graves' disease Maternal Grandmother    Diabetes Maternal Grandfather    Heart attack Paternal Grandfather    Diabetes Paternal Grandfather     Social History:   Academic/Vocational: full time employment Social History   Socioeconomic History   Marital status: Married    Spouse name: Not on file   Number of children: Not on file   Years of education: Not on file   Highest education level: Not on file  Occupational History   Not on file  Tobacco Use   Smoking status: Never   Smokeless tobacco: Never  Vaping Use   Vaping status: Never Used  Substance and Sexual Activity   Alcohol use: Not Currently   Drug use: Never   Sexual activity: Not on file  Other Topics Concern   Not on file  Social History Narrative   Not on file   Social Drivers of Health   Financial Resource Strain: Low Risk  (07/12/2018)   Overall Financial Resource Strain (CARDIA)    Difficulty of Paying Living Expenses: Not hard at all  Food Insecurity: No Food Insecurity (07/12/2018)   Hunger Vital Sign    Worried About Running Out of Food in the Last Year: Never true    Ran Out of Food in the Last Year: Never true  Transportation Needs: Unknown (07/12/2018)   PRAPARE - Administrator, Civil Service (Medical): No    Lack of Transportation (Non-Medical): Not on file  Physical Activity: Not on file  Stress: No Stress Concern Present (07/12/2018)   [employer] of Occupational Health - Occupational Stress Questionnaire     Feeling of Stress : Only a little  Social Connections: Not on file    Additional Social History: updated  Allergies:  No Known Allergies  Current Medications: Current Outpatient Medications  Medication Sig Dispense Refill   acetaminophen  (TYLENOL ) 500 MG tablet You can take 1000 mg of Tylenol /acetaminophen  every 8 hours as needed for pain.  For the next 24-48hrs we recommend you just take it on a schedule.  As pain improves you can go back to using it as needed.  You can buy this over-the-counter at any drugstore.  Do not exceed 4000 mg of Tylenol  per day it can harm your liver. 30 tablet 0   aspirin  EC 325 MG tablet Take 1 tablet (325 mg total) by mouth daily. 14 tablet 0   busPIRone  (BUSPAR ) 15 MG tablet Take 1 tablet (15 mg total) by mouth 2 (two) times daily. 180 tablet 0   DULoxetine  (CYMBALTA ) 60 MG capsule Take 1 capsule (60 mg total) by mouth daily. 90 capsule 0   ibuprofen  (ADVIL ) 200 MG tablet You can take 2 to 3 tablets every 6 hours as needed for pain.  This is your second pain control medication.  You can alternate this with plain Tylenol /acetaminophen .  You can buy this over-the-counter at any drugstore.     L-Methylfolate 15 MG  TABS Take 1 tablet (15 mg total) by mouth daily. 90 tablet 0   Multiple Vitamins-Minerals (MULTIVITAMIN WITH MINERALS) tablet Take 1 tablet by mouth daily.     naltrexone  (DEPADE) 50 MG tablet Take 1 tablet (50 mg total) by mouth at bedtime. 90 tablet 0   oxyCODONE  (ROXICODONE ) 5 MG immediate release tablet Take 1 tablet (5 mg total) by mouth every 4 (four) hours as needed for severe pain (pain score 7-10) or breakthrough pain. 10 tablet 0   traZODone  (DESYREL ) 50 MG tablet Take 0.5-1 tablets (25-50 mg total) by mouth at bedtime as needed for sleep. 90 tablet 0   No current facility-administered medications for this visit.    ROS: Review of Systems   Objective:  Psychiatric Specialty Exam:  unknown if currently breastfeeding.There is no height or  weight on file to calculate BMI.  General Appearance: Well Groomed  Eye Contact:  Good  Speech:  Clear and Coherent and Normal Rate  Volume:  Normal  Mood:  Euthymic  Affect:  Appropriate and Congruent  Thought Content: Logical   Suicidal Thoughts:  No  Homicidal Thoughts:  No  Thought Process:  Coherent, Goal Directed, and Linear  Orientation:  Full (Time, Place, and Person)  Judgment:  Intact  Insight:  Good  Concentration:  Concentration: Good  Fund of Knowledge: Fair  Language: Fair  Psychomotor Activity:  Normal  Akathisia:  No  AIMS (if indicated): not done  Assets:  Communication Skills Desire for Improvement Financial Resources/Insurance Housing Intimacy Leisure Time Physical Health Resilience Social Support Talents/Skills Transportation Vocational/Educational  ADL's:  Intact  Cognition: WNL      PE: General: well-appearing; no acute distress  Pulm: no increased work of breathing on room air  Strength & Muscle Tone: within normal limits Neuro: no focal neurological deficits observed  Gait & Station: normal   Screenings:  PHQ2-9    Flowsheet Row US  MFM OB FOLLOW UP from 11/02/[facility] in [facility]  PHQ-2 Total Score 0   Flowsheet Row ED from 11/30/2021 in [facility] Emergency Department at [facility]  C-SSRS RISK CATEGORY No Risk     Prentice Espy, MD 10/17/20255:08 PM

## 2024-03-01 ENCOUNTER — Encounter (HOSPITAL_BASED_OUTPATIENT_CLINIC_OR_DEPARTMENT_OTHER): Payer: Self-pay | Admitting: Orthopaedic Surgery

## 2024-03-07 ENCOUNTER — Ambulatory Visit: Payer: Self-pay | Admitting: Family Medicine

## 2024-03-07 DIAGNOSIS — D225 Melanocytic nevi of trunk: Secondary | ICD-10-CM | POA: Diagnosis not present

## 2024-03-07 DIAGNOSIS — L821 Other seborrheic keratosis: Secondary | ICD-10-CM | POA: Diagnosis not present

## 2024-03-07 DIAGNOSIS — L814 Other melanin hyperpigmentation: Secondary | ICD-10-CM | POA: Diagnosis not present

## 2024-03-07 DIAGNOSIS — D2272 Melanocytic nevi of left lower limb, including hip: Secondary | ICD-10-CM | POA: Diagnosis not present

## 2024-03-07 NOTE — Progress Notes (Signed)
 Addendum reviewed.  Initial review of MRI by me stated concern for likely labral tear.  Addended report was requested by orthopedic surgeon as surgery was initially denied.  She is scheduled for hip arthroscopy 03/15/24 with Dr. Genelle.  Patient is aware of findings.

## 2024-03-08 ENCOUNTER — Encounter (HOSPITAL_BASED_OUTPATIENT_CLINIC_OR_DEPARTMENT_OTHER): Payer: Self-pay | Admitting: Orthopaedic Surgery

## 2024-03-08 ENCOUNTER — Other Ambulatory Visit: Payer: Self-pay

## 2024-03-09 ENCOUNTER — Encounter (HOSPITAL_BASED_OUTPATIENT_CLINIC_OR_DEPARTMENT_OTHER): Payer: Self-pay | Admitting: Orthopaedic Surgery

## 2024-03-11 NOTE — Progress Notes (Signed)

## 2024-03-14 ENCOUNTER — Encounter: Payer: Self-pay | Admitting: Radiology

## 2024-03-15 ENCOUNTER — Ambulatory Visit (HOSPITAL_BASED_OUTPATIENT_CLINIC_OR_DEPARTMENT_OTHER): Admitting: Certified Registered"

## 2024-03-15 ENCOUNTER — Other Ambulatory Visit: Payer: Self-pay

## 2024-03-15 ENCOUNTER — Ambulatory Visit (HOSPITAL_BASED_OUTPATIENT_CLINIC_OR_DEPARTMENT_OTHER)
Admission: RE | Admit: 2024-03-15 | Discharge: 2024-03-15 | Disposition: A | Attending: Orthopaedic Surgery | Admitting: Orthopaedic Surgery

## 2024-03-15 ENCOUNTER — Ambulatory Visit (HOSPITAL_COMMUNITY)

## 2024-03-15 ENCOUNTER — Encounter (HOSPITAL_BASED_OUTPATIENT_CLINIC_OR_DEPARTMENT_OTHER): Payer: Self-pay | Admitting: Orthopaedic Surgery

## 2024-03-15 ENCOUNTER — Encounter (HOSPITAL_BASED_OUTPATIENT_CLINIC_OR_DEPARTMENT_OTHER): Admission: RE | Disposition: A | Payer: Self-pay | Source: Home / Self Care | Attending: Orthopaedic Surgery

## 2024-03-15 DIAGNOSIS — F419 Anxiety disorder, unspecified: Secondary | ICD-10-CM | POA: Insufficient documentation

## 2024-03-15 DIAGNOSIS — F32A Depression, unspecified: Secondary | ICD-10-CM | POA: Insufficient documentation

## 2024-03-15 DIAGNOSIS — M25352 Other instability, left hip: Secondary | ICD-10-CM | POA: Insufficient documentation

## 2024-03-15 DIAGNOSIS — X58XXXA Exposure to other specified factors, initial encounter: Secondary | ICD-10-CM | POA: Insufficient documentation

## 2024-03-15 DIAGNOSIS — S73192A Other sprain of left hip, initial encounter: Secondary | ICD-10-CM | POA: Diagnosis not present

## 2024-03-15 DIAGNOSIS — Z01818 Encounter for other preprocedural examination: Secondary | ICD-10-CM

## 2024-03-15 LAB — POCT PREGNANCY, URINE: Preg Test, Ur: NEGATIVE

## 2024-03-15 SURGERY — ARTHROSCOPY, HIP, WITH LABRUM REPAIR
Anesthesia: General | Site: Hip | Laterality: Left

## 2024-03-15 MED ORDER — ACETAMINOPHEN 500 MG PO TABS
ORAL_TABLET | ORAL | Status: AC
  Filled 2024-03-15: qty 2

## 2024-03-15 MED ORDER — OXYCODONE HCL 5 MG PO TABS
5.0000 mg | ORAL_TABLET | Freq: Once | ORAL | Status: AC | PRN
  Administered 2024-03-15: 5 mg via ORAL

## 2024-03-15 MED ORDER — OXYCODONE HCL 5 MG/5ML PO SOLN: 5.0000 mg | Freq: Once | ORAL | Status: AC | PRN

## 2024-03-15 MED ORDER — PROPOFOL 10 MG/ML IV BOLUS
INTRAVENOUS | Status: DC | PRN
  Administered 2024-03-15: 150 mg via INTRAVENOUS

## 2024-03-15 MED ORDER — ACETAMINOPHEN 500 MG PO TABS: 1000.0000 mg | ORAL_TABLET | Freq: Once | ORAL | Status: AC

## 2024-03-15 MED ORDER — SODIUM CHLORIDE 0.9 % IR SOLN
Status: DC | PRN
  Administered 2024-03-15: 9000 mL

## 2024-03-15 MED ORDER — CELECOXIB 200 MG PO CAPS
200.0000 mg | ORAL_CAPSULE | Freq: Once | ORAL | Status: AC
  Administered 2024-03-15: 200 mg via ORAL

## 2024-03-15 MED ORDER — TRANEXAMIC ACID-NACL 1000-0.7 MG/100ML-% IV SOLN
INTRAVENOUS | Status: AC
Start: 2024-03-15 — End: 2024-03-15
  Filled 2024-03-15: qty 100

## 2024-03-15 MED ORDER — ONDANSETRON HCL 4 MG/2ML IJ SOLN
INTRAMUSCULAR | Status: AC
  Filled 2024-03-15: qty 2

## 2024-03-15 MED ORDER — ACETAMINOPHEN 500 MG PO TABS
1000.0000 mg | ORAL_TABLET | Freq: Once | ORAL | Status: AC
  Administered 2024-03-15: 1000 mg via ORAL

## 2024-03-15 MED ORDER — SUGAMMADEX SODIUM 200 MG/2ML IV SOLN
INTRAVENOUS | Status: DC | PRN
  Administered 2024-03-15: 250 mg via INTRAVENOUS

## 2024-03-15 MED ORDER — CEFAZOLIN SODIUM-DEXTROSE 2-4 GM/100ML-% IV SOLN
2.0000 g | INTRAVENOUS | Status: AC
  Administered 2024-03-15: 2 g via INTRAVENOUS

## 2024-03-15 MED ORDER — FENTANYL CITRATE (PF) 100 MCG/2ML IJ SOLN
INTRAMUSCULAR | Status: AC
  Filled 2024-03-15: qty 2

## 2024-03-15 MED ORDER — MIDAZOLAM HCL 5 MG/5ML IJ SOLN
INTRAMUSCULAR | Status: DC | PRN
  Administered 2024-03-15: 2 mg via INTRAVENOUS

## 2024-03-15 MED ORDER — BUPIVACAINE HCL 0.25 % IJ SOLN
INTRAMUSCULAR | Status: DC | PRN
  Administered 2024-03-15: 20 mL

## 2024-03-15 MED ORDER — LACTATED RINGERS IV SOLN: INTRAVENOUS | Status: DC

## 2024-03-15 MED ORDER — OXYCODONE HCL 5 MG PO TABS
ORAL_TABLET | ORAL | Status: AC
  Filled 2024-03-15: qty 1

## 2024-03-15 MED ORDER — PROPOFOL 10 MG/ML IV BOLUS
INTRAVENOUS | Status: AC
  Filled 2024-03-15: qty 20

## 2024-03-15 MED ORDER — CEFAZOLIN SODIUM-DEXTROSE 2-4 GM/100ML-% IV SOLN
INTRAVENOUS | Status: AC
Start: 2024-03-15 — End: 2024-03-15
  Filled 2024-03-15: qty 100

## 2024-03-15 MED ORDER — ONDANSETRON HCL 4 MG/2ML IJ SOLN
INTRAMUSCULAR | Status: DC | PRN
  Administered 2024-03-15: 4 mg via INTRAVENOUS

## 2024-03-15 MED ORDER — BUPIVACAINE HCL (PF) 0.25 % IJ SOLN
INTRAMUSCULAR | Status: AC
  Filled 2024-03-15: qty 30

## 2024-03-15 MED ORDER — FENTANYL CITRATE (PF) 100 MCG/2ML IJ SOLN
INTRAMUSCULAR | Status: DC | PRN
  Administered 2024-03-15: 100 ug via INTRAVENOUS

## 2024-03-15 MED ORDER — TRANEXAMIC ACID-NACL 1000-0.7 MG/100ML-% IV SOLN
1000.0000 mg | INTRAVENOUS | Status: AC
  Administered 2024-03-15: 1000 mg via INTRAVENOUS

## 2024-03-15 MED ORDER — CELECOXIB 200 MG PO CAPS
ORAL_CAPSULE | ORAL | Status: AC
  Filled 2024-03-15: qty 1

## 2024-03-15 MED ORDER — MIDAZOLAM HCL 2 MG/2ML IJ SOLN
INTRAMUSCULAR | Status: AC
  Filled 2024-03-15: qty 2

## 2024-03-15 MED ORDER — LIDOCAINE HCL (CARDIAC) PF 100 MG/5ML IV SOSY
PREFILLED_SYRINGE | INTRAVENOUS | Status: DC | PRN
  Administered 2024-03-15: 60 mg via INTRAVENOUS

## 2024-03-15 MED ORDER — GABAPENTIN 300 MG PO CAPS
300.0000 mg | ORAL_CAPSULE | Freq: Once | ORAL | Status: AC
  Administered 2024-03-15: 300 mg via ORAL

## 2024-03-15 MED ORDER — GABAPENTIN 300 MG PO CAPS
ORAL_CAPSULE | ORAL | Status: AC
  Filled 2024-03-15: qty 1

## 2024-03-15 MED ORDER — DROPERIDOL 2.5 MG/ML IJ SOLN: 0.6250 mg | Freq: Once | INTRAMUSCULAR | Status: DC | PRN

## 2024-03-15 MED ORDER — VANCOMYCIN HCL 1000 MG IV SOLR
INTRAVENOUS | Status: AC
  Filled 2024-03-15: qty 40

## 2024-03-15 MED ORDER — DEXAMETHASONE SODIUM PHOSPHATE 4 MG/ML IJ SOLN
INTRAMUSCULAR | Status: DC | PRN
Start: 2024-03-15 — End: 2024-03-15
  Administered 2024-03-15: 5 mg via INTRAVENOUS

## 2024-03-15 MED ORDER — LIDOCAINE 2% (20 MG/ML) 5 ML SYRINGE
INTRAMUSCULAR | Status: AC
  Filled 2024-03-15: qty 5

## 2024-03-15 MED ORDER — FENTANYL CITRATE (PF) 100 MCG/2ML IJ SOLN
25.0000 ug | INTRAMUSCULAR | Status: DC | PRN
  Administered 2024-03-15 (×3): 50 ug via INTRAVENOUS

## 2024-03-15 MED ORDER — ROCURONIUM BROMIDE 100 MG/10ML IV SOLN
INTRAVENOUS | Status: DC | PRN
  Administered 2024-03-15: 50 mg via INTRAVENOUS
  Administered 2024-03-15: 20 mg via INTRAVENOUS
  Administered 2024-03-15: 10 mg via INTRAVENOUS

## 2024-03-15 SURGICAL SUPPLY — 58 items
ANCHOR SUT 1.4 W1.2 XBRAID (Anchor) IMPLANT
BIT DRILL 1.4 ICONIX (BIT) IMPLANT
BLADE SAMURAI STR FULL RADIUS (BLADE) IMPLANT
BLADE SURG 11 STRL SS (BLADE) ×1 IMPLANT
BUR ROUND HI FLUTE 8 4X19 (BURR) IMPLANT
CANISTER SUCT 1200ML W/VALVE (MISCELLANEOUS) ×1 IMPLANT
CANNULA OBTURATOR FLOWPORT ST5 (CANNULA) IMPLANT
CHLORAPREP W/TINT 26 (MISCELLANEOUS) ×1 IMPLANT
COOLER ICEMAN CLASSIC (MISCELLANEOUS) ×1 IMPLANT
COVER BACK TABLE 60X90IN (DRAPES) ×1 IMPLANT
COVER MAYO STAND STRL (DRAPES) ×1 IMPLANT
DERMABOND ADVANCED .7 DNX12 (GAUZE/BANDAGES/DRESSINGS) IMPLANT
DISSECTOR 4.2MMX19CM HL (MISCELLANEOUS) ×1 IMPLANT
DRAPE C-ARM 42X72 X-RAY (DRAPES) ×1 IMPLANT
DRAPE STERI IOBAN 125X83 (DRAPES) IMPLANT
DRAPE U-SHAPE 47X51 STRL (DRAPES) ×2 IMPLANT
DRSG TEGADERM 4X4.75 (GAUZE/BANDAGES/DRESSINGS) ×3 IMPLANT
FEE RENTAL EQUIP HIP INSTR KIT (INSTRUMENTS) IMPLANT
GAUZE PAD ABD 8X10 STRL (GAUZE/BANDAGES/DRESSINGS) IMPLANT
GAUZE SPONGE 4X4 12PLY STRL (GAUZE/BANDAGES/DRESSINGS) ×1 IMPLANT
GAUZE XEROFORM 1X8 LF (GAUZE/BANDAGES/DRESSINGS) ×1 IMPLANT
GLOVE BIO SURGEON STRL SZ 6 (GLOVE) ×2 IMPLANT
GLOVE BIO SURGEON STRL SZ7.5 (GLOVE) ×2 IMPLANT
GLOVE BIOGEL PI IND STRL 6.5 (GLOVE) ×1 IMPLANT
GLOVE BIOGEL PI IND STRL 7.0 (GLOVE) IMPLANT
GLOVE BIOGEL PI IND STRL 8 (GLOVE) ×1 IMPLANT
GLOVE ECLIPSE 6.5 STRL STRAW (GLOVE) IMPLANT
GOWN STRL REUS W/ TWL LRG LVL3 (GOWN DISPOSABLE) ×2 IMPLANT
GOWN STRL REUS W/TWL XL LVL3 (GOWN DISPOSABLE) ×1 IMPLANT
INSTRUMENT ORTHO TEXT HIP FEM (INSTRUMENTS) IMPLANT
KIT PATIENT POSITION MEDIUM (KITS) IMPLANT
KIT PORTAL ENTRY HIP ACCESS (KITS) IMPLANT
MANIFOLD NEPTUNE II (INSTRUMENTS) ×1 IMPLANT
NDL HYPO 22X1.5 SAFETY MO (MISCELLANEOUS) IMPLANT
NDL INJECTOR II CARTRIDGE (MISCELLANEOUS) IMPLANT
NDL SPNL 18GX3.5 QUINCKE PK (NEEDLE) IMPLANT
NDL SUT 6 .5 CRC .975X.05 MAYO (NEEDLE) IMPLANT
NEEDLE HYPO 22X1.5 SAFETY MO (MISCELLANEOUS) IMPLANT
NEEDLE INJECTOR II CARTRIDGE (MISCELLANEOUS) ×1 IMPLANT
NEEDLE SPNL 18GX3.5 QUINCKE PK (NEEDLE) IMPLANT
PACK BASIN DAY SURGERY FS (CUSTOM PROCEDURE TRAY) ×1 IMPLANT
PAD COLD SHLDR WRAP-ON (PAD) ×1 IMPLANT
PASSER SUT 1.5D CRESCENT (INSTRUMENTS) IMPLANT
PASSER SUT 70D UP ANGLED (INSTRUMENTS) IMPLANT
SPIKE FLUID TRANSFER (MISCELLANEOUS) IMPLANT
SPONGE T-LAP 18X18 ~~LOC~~+RFID (SPONGE) IMPLANT
SUT ETHILON 3 0 PS 1 (SUTURE) ×1 IMPLANT
SUT VIC AB 0 CT1 27XBRD ANBCTR (SUTURE) IMPLANT
SUT VIC AB 2-0 CT1 TAPERPNT 27 (SUTURE) IMPLANT
SUT XBRAID 1.4 BLUE/BLACK (SUTURE) IMPLANT
SUTURE FIBERWR #2 38 T-5 BLUE (SUTURE) IMPLANT
SUTURE TAPE 1.3 FIBERLOP 20 ST (SUTURE) IMPLANT
SYR 20ML LL LF (SYRINGE) IMPLANT
SYR 50ML LL SCALE MARK (SYRINGE) IMPLANT
TOWEL GREEN STERILE FF (TOWEL DISPOSABLE) ×2 IMPLANT
TUBE CONNECTING 20X1/4 (TUBING) ×3 IMPLANT
TUBING ARTHROSCOPY IRRIG 16FT (MISCELLANEOUS) ×1 IMPLANT
WAND APOLLO RF 50D ABLATOR (BUR) ×1 IMPLANT

## 2024-03-15 NOTE — Op Note (Signed)
 Date of Surgery: 03/15/2024  INDICATIONS: Ms. Harewood is a 38 y.o.-year-old female with left hip labral tear.  The risk and benefits of the procedure were discussed in detail and documented in the pre-operative evaluation.   PREOPERATIVE DIAGNOSIS: 1. Left hip labral tear  POSTOPERATIVE DIAGNOSIS: Same.  PROCEDURE: 1. Left hip labral repair  SURGEON: Elspeth LITTIE Parker MD  ASSISTANT: Conley Dawson, ATC  ANESTHESIA:  general  IV FLUIDS AND URINE: See anesthesia record.  ANTIBIOTICS: Ancef  ESTIMATED BLOOD LOSS: 5 mL.  IMPLANTS:  Implant Name Type Inv. Item Serial No. Manufacturer Lot No. LRB No. Used Action  ANCHOR SUT 1.4 W1.2 XBRAID - [id number] Anchor ANCHOR SUT 1.4 W1.2 [employer] [id number] Left 2 Implanted    DRAINS: None  CULTURES: None  COMPLICATIONS: none  DESCRIPTION OF PROCEDURE:   Cartilage Intact femoral and acetabular cartilage   Labrum Normoplastic/softened appearing with positive chondral rug   Boundaries of labral tear Convention (3 o'clock anterior, 9 o'clock posterior) Anterior boundary: 3 o'clock Posterior boundary: 1 o'clock   OPERATIVE REPORT:  The patient was brought to the operating room, placed supine on the operating table, and bony prominences were padded.  The traction boots were applied with padding to ensure that safe traction could be applied through the feet.  The contralateral limb was abducted maximally and light traction was applied.  The operative leg was brought into neutral position.  The flouroscopic c-arm was brought between the legs for an AP image.  The patient was prepped and draped in a sterile fashion.  Time-out was performed and landmarks were identified. Traction was obtained and care was taken to ensure the least amount of force necessary to allow safe access to the joint of 8-58mm.  This was checked with fluoroscopy.    Next we placed an anterolateral portal under the assistance of fluoroscopy.  First,  fluoroscopy was used to estimate the trajectory and starting point.  A 5mm incision with a #11 blade was made and a straight hemostat was used to dilate the portal through the appropriate tract.  We then placed a 14-gauge hypodermic needle with careful technique to be as close to the femoral head as possible and parallel to the sorcele to ensure no iatrogenic damage to the labrum.  This released the negative pressure environment and the amount of traction was adjusted to maintain the 8-47mm of distraction.  A nitinol wire was placed through the needle and flouroscopy was used to ensure it extended to the medial wall of the acetabulum.  The Flowport from [employer] was placed over the wire and the nitinol wire was retracted to just inside the capsule during insertion of the dilator and cannula to minimize the risk of breakage. The arthroscope was placed next and we visualized the anterior triangle.     We then placed the anterior portal under direct visualization using the technique described above.  This was safely placed as well without damage to the labrum or femoral head.  We then switched our arthroscope to the anterior portal to ensure we were not through the labrum - we were safely through the capsule only.  We then proceeded with periportal capsulotomies utilizing the Samurai blade in each portal without connecting the two.  We identified the anterior inferior iliac spine proximally, the psoas tendon medially and the rectus tendon laterally as landmarks.  We then proceeded with a diagnostic arthroscopy - the results can be found in the findings section above.  We then used the radiofrequency device to clear the superior acetabulum and expose the subspinous region.  Next we exposed the acetabular rim leaving the chondral labral junction intact.   When adequate reshaping was obtained we then proceeded with the labral repair. We placed 2 anchors at the 1:00 and 2:30 positions. The sutures  were passed using the crescent Nanopass from [employer].  This resulted in anatomic labral repair.  We debrided the loose cartilage at the rim and residual degenerative labral tissue.  Traction was let down with total traction time of 35 minutes.     Finally, we performed a complete capsular closure with tape suture.  She was replaced in the anterior and posterior limb of the reported capsulotomy with excellent apposition. We then removed the arthroscope and closed the incisions with 3-0 nylon simple stitches.  A sterile dressing was applied..  The patient was awakened from anesthesia and transferred to PACU in stable condition. Postoperative care includes:       POSTOPERATIVE PLAN:    Weight bearing as tolerated operative extremity Formal physical therapy will begin immediately within the first weeks of surgery ASA 325 Daily for DVT prophylaxis      Elspeth LITTIE Parker, MD 9:25 AM

## 2024-03-15 NOTE — Brief Op Note (Signed)
   Brief Op Note  Date of Surgery: 03/15/2024  Preoperative Diagnosis: LEFT HIP INSTABILITY  Postoperative Diagnosis: same  Procedure: Procedure(s): ARTHROSCOPY, HIP, WITH LABRUM REPAIR  Implants: Implant Name Type Inv. Item Serial No. Manufacturer Lot No. LRB No. Used Action  ANCHOR SUT 1.4 W1.2 [place] - [id number] Anchor ANCHOR SUT 1.4 W1.2 [employer] [id number] Left 2 Implanted    Surgeons: Surgeon(s): Genelle Standing, MD  Anesthesia: General    Estimated Blood Loss: See anesthesia record  Complications: None  Condition to PACU: Stable  Standing LITTIE Genelle, MD 03/15/2024 9:25 AM

## 2024-03-15 NOTE — Transfer of Care (Signed)
 Immediate Anesthesia Transfer of Care Note  Patient: Kristine Graves  Procedure(s) Performed: ARTHROSCOPY, HIP, WITH LABRUM REPAIR (Left: Hip)  Patient Location: PACU  Anesthesia Type:General  Level of Consciousness: awake, alert , and patient cooperative  Airway & Oxygen Therapy: Patient Spontanous Breathing and Patient connected to face mask oxygen  Post-op Assessment: Report given to RN and Post -op Vital signs reviewed and unstable, Anesthesiologist notified  Post vital signs: Reviewed and stable  Last Vitals:  Vitals Value Taken Time  BP 112/67 03/15/24 08:57  Temp    Pulse 83 03/15/24 08:58  Resp 18 03/15/24 08:58  SpO2 100 % 03/15/24 08:58  Vitals shown include unfiled device data.  Last Pain:  Vitals:   03/15/24 0641  TempSrc: Temporal  PainSc: 1       Patients Stated Pain Goal: 5 (03/15/24 0641)  Complications: No notable events documented.

## 2024-03-15 NOTE — Anesthesia Procedure Notes (Addendum)
 Procedure Name: Intubation Date/Time: 03/15/2024 7:52 AM  Performed by: Burnard Rosaline HERO, CRNAPre-anesthesia Checklist: Patient identified, Emergency Drugs available, Suction available and Patient being monitored Patient Re-evaluated:Patient Re-evaluated prior to induction Oxygen Delivery Method: Circle system utilized Preoxygenation: Pre-oxygenation with 100% oxygen Induction Type: IV induction Ventilation: Mask ventilation without difficulty Laryngoscope Size: Mac and 4 Grade View: Grade I Tube type: Oral Tube size: 7.0 mm Number of attempts: 1 Airway Equipment and Method: Stylet and Oral airway Placement Confirmation: ETT inserted through vocal cords under direct vision, positive ETCO2, breath sounds checked- equal and bilateral and CO2 detector Secured at: 21 cm Tube secured with: Tape Dental Injury: Teeth and Oropharynx as per pre-operative assessment

## 2024-03-15 NOTE — Discharge Instructions (Addendum)
 Discharge Instructions    Attending Surgeon: Elspeth Parker, MD Office Phone Number: [phone]   Diagnosis and Procedures:    Surgeries Performed: Left hip labral tear  Discharge Plan:    Diet: Resume usual diet. Begin with light or bland foods.  Drink plenty of fluids.  Activity:  Left hip labral repair. You are advised to go home directly from the hospital or surgical center. Restrict your activities.  GENERAL INSTRUCTIONS: 1.  Please apply ice to your wound to help with swelling and inflammation. This will improve your comfort and your overall recovery following surgery.     2. Please call Dr. Danetta office at [phone] with questions Monday-Friday during business hours. If no one answers, please leave a message and someone should get back to the patient within 24 hours. For emergencies please call 911 or proceed to the emergency room.   3. Patient to notify surgical team if experiences any of the following: Bowel/Bladder dysfunction, uncontrolled pain, nerve/muscle weakness, incision with increased drainage or redness, nausea/vomiting and Fever greater than 101.0 F.  Be alert for signs of infection including redness, streaking, odor, fever or chills. Be alert for excessive pain or bleeding and notify your surgeon immediately.  WOUND INSTRUCTIONS:   Leave your dressing, cast, or splint in place until your post operative visit.  Keep it clean and dry.  Always keep the incision clean and dry until the staples/sutures are removed. If there is no drainage from the incision you should keep it open to air. If there is drainage from the incision you must keep it covered at all times until the drainage stops  Do not soak in a bath tub, hot tub, pool, lake or other body of water until 21 days after your surgery and your incision is completely dry and healed.  If you have removable sutures (or staples) they must be removed 10-14 days (unless otherwise instructed) from the  day of your surgery.     1)  Elevate the extremity as much as possible.  2)  Keep the dressing clean and dry.  3)  Please call us  if the dressing becomes wet or dirty.  4)  If you are experiencing worsening pain or worsening swelling, please call.     MEDICATIONS: Resume all previous home medications at the previous prescribed dose and frequency unless otherwise noted Start taking the  pain medications on an as-needed basis as prescribed  Please taper down pain medication over the next week following surgery.  Ideally you should not require a refill of any narcotic pain medication.  Take pain medication with food to minimize nausea. In addition to the prescribed pain medication, you may take over-the-counter pain relievers such as Tylenol .  Do NOT take additional tylenol  if your pain medication already has tylenol  in it.  Aspirin  325mg  daily per instructions on bottle. Narcotic policy: Per [facility] clinic policy, our goal is ensure optimal postoperative pain control with a multimodal pain management strategy. For all [facility] patients, our goal is to wean post-operative narcotic medications by 6 weeks post-operatively, and many times sooner. If this is not possible due to utilization of pain medication prior to surgery, your [facility] doctor will support your acute post-operative pain control for the first 6 weeks postoperatively, with a plan to transition you back to your primary pain team following that. Maralee will work to ensure a therapist, occupational.       FOLLOWUP INSTRUCTIONS: 1. Follow up at the Physical Therapy Clinic 3-4  days following surgery. This appointment should be scheduled unless other arrangements have been made.The Physical Therapy scheduling number is [phone] if an appointment has not already been arranged.  2. Contact Dr. Danetta office during office hours at [phone] or the practice after hours line at [phone] for non-emergencies. For medical  emergencies call 911.   Discharge Location: Home   Post Anesthesia Home Care Instructions  Activity: Get plenty of rest for the remainder of the day. A responsible individual must stay with you for 24 hours following the procedure.  For the next 24 hours, DO NOT: -Drive a car -Advertising copywriter -Drink alcoholic beverages -Take any medication unless instructed by your physician -Make any legal decisions or sign important papers.  Meals: Start with liquid foods such as gelatin or soup. Progress to regular foods as tolerated. Avoid greasy, spicy, heavy foods. If nausea and/or vomiting occur, drink only clear liquids until the nausea and/or vomiting subsides. Call your physician if vomiting continues.  Special Instructions/Symptoms: Your throat may feel dry or sore from the anesthesia or the breathing tube placed in your throat during surgery. If this causes discomfort, gargle with warm salt water. The discomfort should disappear within 24 hours.  May have Tylenol  after 12:45pm if needed.

## 2024-03-15 NOTE — H&P (Signed)
 Expand All Collapse All       Chief Complaint: Left hip pain        History of Present Illness:      Kristine Graves is a 38 y.o. female presents with ongoing left hip pain for the last several years.  She has been working with physical therapy.  She works for the [employer].  She is very active including doing barre which has been completely limited as a result of her hip pain.  She does experience the pain in the groin.  This does wraparound in a C distribution.  She has been working on strengthening as well as physical therapy without any relief       PMH/PSH/Family History/Social History/Meds/Allergies:         Past Medical History:  Diagnosis Date   Abnormal maternal serum screening test 06/07/2015    1:39 Down syndrome risk from first trimester screening- NIPS drawn 06/07/15     Acute appendicitis 06/09/2019   Anxiety     Blood transfusion without reported diagnosis      at birth RH incompatibility   Depression     Elevated liver enzymes 11/30/2021   High risk pregnancy with low PAPPA 06/07/2015   History of appendectomy 11/30/2021   Hypercalcemia 11/30/2021   Hyponatremia 11/30/2021   Indication for care in labor or delivery 07/13/2018   Metabolic acidosis, increased anion gap 11/30/2021   Polycythemia 11/30/2021   SBO (small bowel obstruction) (HCC) 11/30/2021   SVD (spontaneous vaginal delivery) 07/13/2018             Past Surgical History:  Procedure Laterality Date   BREAST ENHANCEMENT SURGERY       LAPAROSCOPIC APPENDECTOMY N/A 06/09/2019    Procedure: APPENDECTOMY LAPAROSCOPIC;  Surgeon: Tanda Locus, MD;  [facility]: [facility] OR;  Service: General;  Laterality: N/A;   NASAL SINUS SURGERY            Social History         Socioeconomic History   Marital status: Married      Spouse name: Not on file   Number of children: Not on file   Years of education: Not on file   Highest education level: Not on file  Occupational  History   Not on file  Tobacco Use   Smoking status: Never   Smokeless tobacco: Never  Vaping Use   Vaping status: Never Used  Substance and Sexual Activity   Alcohol use: Not Currently   Drug use: Never   Sexual activity: Not on file  Other Topics Concern   Not on file  Social History Narrative   Not on file    Social Drivers of Health        Financial Resource Strain: Low Risk  (07/12/2018)    Overall Financial Resource Strain (CARDIA)     Difficulty of Paying Living Expenses: Not hard at all  Food Insecurity: No Food Insecurity (07/12/2018)    Hunger Vital Sign     Worried About Running Out of Food in the Last Year: Never true     Ran Out of Food in the Last Year: Never true  Transportation Needs: Unknown (07/12/2018)    PRAPARE - Therapist, Art (Medical): No     Lack of Transportation (Non-Medical): Not on file  Physical Activity: Not on file  Stress: No Stress Concern Present (07/12/2018)    [employer] of Occupational Health - Occupational Stress Questionnaire  Feeling of Stress : Only a little  Social Connections: Not on file         Family History  Problem Relation Age of Onset   Hypertension Father     Graves' disease Maternal Grandmother     Diabetes Maternal Grandfather     Heart attack Paternal Grandfather     Diabetes Paternal Grandfather          Allergies  No Known Allergies         Current Outpatient Medications  Medication Sig Dispense Refill   acetaminophen  (TYLENOL ) 500 MG tablet Take 1 tablet (500 mg total) by mouth every 8 (eight) hours for 10 days. 30 tablet 0   aspirin  EC 325 MG tablet Take 1 tablet (325 mg total) by mouth daily. 14 tablet 0   ibuprofen  (ADVIL ) 800 MG tablet Take 1 tablet (800 mg total) by mouth every 8 (eight) hours for 10 days. Please take with food, please alternate with acetaminophen  30 tablet 0   oxyCODONE  (ROXICODONE ) 5 MG immediate release tablet Take 1 tablet (5 mg total) by mouth every  4 (four) hours as needed for severe pain (pain score 7-10) or breakthrough pain. 10 tablet 0   acetaminophen  (TYLENOL ) 500 MG tablet You can take 1000 mg of Tylenol /acetaminophen  every 8 hours as needed for pain.  For the next 24-48hrs we recommend you just take it on a schedule.  As pain improves you can go back to using it as needed.  You can buy this over-the-counter at any drugstore.  Do not exceed 4000 mg of Tylenol  per day it can harm your liver. 30 tablet 0   busPIRone  (BUSPAR ) 15 MG tablet Take 1 tablet (15 mg total) by mouth 2 (two) times daily. 180 tablet 0   DULoxetine  (CYMBALTA ) 60 MG capsule Take 1 capsule (60 mg total) by mouth daily. 90 capsule 0   ibuprofen  (ADVIL ) 200 MG tablet You can take 2 to 3 tablets every 6 hours as needed for pain.  This is your second pain control medication.  You can alternate this with plain Tylenol /acetaminophen .  You can buy this over-the-counter at any drugstore.       L-Methylfolate 15 MG TABS Take 1 tablet (15 mg total) by mouth daily. 90 tablet 0   Multiple Vitamins-Minerals (MULTIVITAMIN WITH MINERALS) tablet Take 1 tablet by mouth daily.       naltrexone  (DEPADE) 50 MG tablet Take 1 tablet (50 mg total) by mouth at bedtime. 90 tablet 0   traZODone  (DESYREL ) 50 MG tablet Take 0.5-1 tablets (25-50 mg total) by mouth at bedtime as needed for sleep. 90 tablet 0      No current facility-administered medications for this visit.      Imaging Results (Last 48 hours)  No results found.     Review of Systems:   A ROS was performed including pertinent positives and negatives as documented in the HPI.   Physical Exam :   Constitutional: NAD and appears stated age Neurological: Alert and oriented Psych: Appropriate affect and cooperative unknown if currently breastfeeding.    Comprehensive Musculoskeletal Exam:     Left hip with positive FADIR maneuver.  90 degrees and 30 degrees internal rotation causes pain.  External rotation improves this.  She  can externally rotate 50 degrees without pain.  Good abduction strength.  Nonantalgic gait distal neurosensory exam is intact     Imaging:   Xray (3 views left hip): 72% acetabular coverage index with tonus grade 0  changes   MRI (left hip): Anterior superior labral tear     I personally reviewed and interpreted the radiographs.     Assessment and Plan:   38 y.o. female with left hip pain and setting of left hip instability and acetabular undercoverage.  I did describe that at this time she has trialed physical therapy and strengthening without any relief.  Given this I did discuss alternatives including left hip arthroscopy with labral repair.  I did discuss the risks and benefits as well as associated recovery timeframe.  After discussion she would like to proceed   -Plan for left hip arthroscopy with labral repair     After a lengthy discussion of treatment options, including risks, benefits, alternatives, complications of surgical and nonsurgical conservative options, the patient elected surgical repair.    The patient  is aware of the material risks  and complications including, but not limited to injury to adjacent structures, neurovascular injury, infection, numbness, bleeding, implant failure, thermal burns, stiffness, persistent pain, failure to heal, disease transmission from allograft, need for further surgery, dislocation, anesthetic risks, blood clots, risks of death,and others. The probabilities of surgical success and failure discussed with patient given their particular co-morbidities.The time and nature of expected rehabilitation and recovery was discussed.The patient's questions were all answered preoperatively.  No barriers to understanding were noted. I explained the natural history of the disease process and Rx rationale.  I explained to the patient what I considered to be reasonable expectations given their personal situation.  The final treatment plan was arrived at  through a shared patient decision making process model.       I personally saw and evaluated the patient, and participated in the management and treatment plan.   Elspeth Parker, MD Attending Physician, Orthopedic Surgery   This document was dictated using Dragon voice recognition software. A reasonable attempt at proof reading has been made to minimize errors.

## 2024-03-15 NOTE — Anesthesia Preprocedure Evaluation (Addendum)
 Anesthesia Evaluation  Patient identified by MRN, date of birth, ID band Patient awake    Reviewed: Allergy & Precautions, NPO status , Patient's Chart, lab work & pertinent test results  Airway Mallampati: I  TM Distance: >3 FB Neck ROM: Full    Dental no notable dental hx.    Pulmonary neg pulmonary ROS   Pulmonary exam normal        Cardiovascular negative cardio ROS  Rhythm:Regular Rate:Normal     Neuro/Psych   Anxiety Depression    negative neurological ROS     GI/Hepatic negative GI ROS, Neg liver ROS,,,  Endo/Other  negative endocrine ROS    Renal/GU negative Renal ROS  negative genitourinary   Musculoskeletal Hip instability    Abdominal Normal abdominal exam  (+)   Peds  Hematology negative hematology ROS (+)   Anesthesia Other Findings   Reproductive/Obstetrics Lab Results      Component                Value               Date                      PREGTESTUR               NEGATIVE            03/15/2024                HCG                      <5.0                11/30/2021                                         Anesthesia Physical Anesthesia Plan  ASA: 2  Anesthesia Plan: General   Post-op Pain Management: Tylenol  PO (pre-op)* and Celebrex PO (pre-op)*   Induction: Intravenous  PONV Risk Score and Plan: 3 and Ondansetron , Dexamethasone , Midazolam  and Treatment may vary due to age or medical condition  Airway Management Planned: Mask and Oral ETT  Additional Equipment: None  Intra-op Plan:   Post-operative Plan: Extubation in OR  Informed Consent: I have reviewed the patients History and Physical, chart, labs and discussed the procedure including the risks, benefits and alternatives for the proposed anesthesia with the patient or authorized representative who has indicated his/her understanding and acceptance.     Dental advisory given  Plan Discussed with:  CRNA  Anesthesia Plan Comments:          Anesthesia Quick Evaluation

## 2024-03-15 NOTE — Anesthesia Postprocedure Evaluation (Signed)
 Anesthesia Post Note  Patient: Kristine Graves  Procedure(s) Performed: ARTHROSCOPY, HIP, WITH LABRUM REPAIR (Left: Hip)     Patient location during evaluation: PACU Anesthesia Type: General Level of consciousness: awake and alert Pain management: pain level controlled Vital Signs Assessment: post-procedure vital signs reviewed and stable Respiratory status: spontaneous breathing, nonlabored ventilation, respiratory function stable and patient connected to nasal cannula oxygen Cardiovascular status: blood pressure returned to baseline and stable Postop Assessment: no apparent nausea or vomiting Anesthetic complications: no   No notable events documented.  Last Vitals:  Vitals:   03/15/24 0915 03/15/24 0954  BP: 117/82 (!) 122/90  Pulse: 70 74  Resp: 18   Temp:  36.8 C  SpO2: 99% 99%    Last Pain:  Vitals:   03/15/24 0954  TempSrc: Temporal  PainSc: 0-No pain                 Cordella P Pearle Wandler

## 2024-03-15 NOTE — Interval H&P Note (Signed)
 History and Physical Interval Note:  03/15/2024 7:00 AM  Kristine Graves  has presented today for surgery, with the diagnosis of LEFT HIP INSTABILITY.  The various methods of treatment have been discussed with the patient and family. After consideration of risks, benefits and other options for treatment, the patient has consented to  Procedure(s): ARTHROSCOPY, HIP, WITH LABRUM REPAIR (Left) as a surgical intervention.  The patient's history has been reviewed, patient examined, no change in status, stable for surgery.  I have reviewed the patient's chart and labs.  Questions were answered to the patient's satisfaction.     [name]

## 2024-03-17 ENCOUNTER — Other Ambulatory Visit: Payer: Self-pay

## 2024-03-17 ENCOUNTER — Ambulatory Visit (HOSPITAL_BASED_OUTPATIENT_CLINIC_OR_DEPARTMENT_OTHER): Attending: Orthopaedic Surgery | Admitting: Physical Therapy

## 2024-03-17 ENCOUNTER — Encounter (HOSPITAL_BASED_OUTPATIENT_CLINIC_OR_DEPARTMENT_OTHER): Payer: Self-pay | Admitting: Physical Therapy

## 2024-03-17 DIAGNOSIS — M25552 Pain in left hip: Secondary | ICD-10-CM | POA: Insufficient documentation

## 2024-03-17 DIAGNOSIS — S73192A Other sprain of left hip, initial encounter: Secondary | ICD-10-CM | POA: Diagnosis not present

## 2024-03-17 DIAGNOSIS — R262 Difficulty in walking, not elsewhere classified: Secondary | ICD-10-CM | POA: Diagnosis not present

## 2024-03-17 DIAGNOSIS — M6281 Muscle weakness (generalized): Secondary | ICD-10-CM | POA: Insufficient documentation

## 2024-03-17 NOTE — Therapy (Signed)
 OUTPATIENT PHYSICAL THERAPY EVALUATION   Patient Name: Delmi Fulfer MRN: [id number] DOB:[birth year], 38 y.o., female Today's Date: 03/17/2024  END OF SESSION:  PT End of Session - 03/17/24 1344     Visit Number 1    Number of Visits 13    Date for Recertification  06/11/24    Authorization Type BCBS    PT Start Time 1343    PT Stop Time 1410    PT Time Calculation (min) 27 min    Activity Tolerance Patient tolerated treatment well    Behavior During Therapy [facility] for tasks assessed/performed          Past Medical History:  Diagnosis Date   Abnormal maternal serum screening test 06/07/2015   1:39 Down syndrome risk from first trimester screening- NIPS drawn 06/07/15     Acute appendicitis 06/09/2019   Anxiety    Blood transfusion without reported diagnosis    at birth RH incompatibility   Depression    Elevated liver enzymes 11/30/2021   High risk pregnancy with low PAPPA 06/07/2015   History of appendectomy 11/30/2021   Hypercalcemia 11/30/2021   Hyponatremia 11/30/2021   Indication for care in labor or delivery 07/13/2018   Metabolic acidosis, increased anion gap 11/30/2021   Polycythemia 11/30/2021   SBO (small bowel obstruction) (HCC) 11/30/2021   SVD (spontaneous vaginal delivery) 07/13/2018   Past Surgical History:  Procedure Laterality Date   BREAST ENHANCEMENT SURGERY     LAPAROSCOPIC APPENDECTOMY N/A 06/09/2019   Procedure: APPENDECTOMY LAPAROSCOPIC;  Surgeon: Tanda Locus, MD;  Location: [facility] OR;  Service: General;  Laterality: N/A;   NASAL SINUS SURGERY     Patient Active Problem List   Diagnosis Date Noted   Tear of left acetabular labrum 03/15/2024   Alcohol use disorder, severe, in early remission (HCC) 06/29/2023   Generalized anxiety disorder 06/29/2023   Recurrent major depressive disorder, in remission 11/30/2021     REFERRING PROVIDER:  Genelle Standing, MD      REFERRING DIAG:  [phone] (ICD-10-CM) - Tear of left acetabular labrum,  initial encounter     S/p PROCEDURE: 1. Left hip labral repair  Rationale for Evaluation and Treatment: Rehabilitation  THERAPY DIAG:  Pain in left hip  Difficulty in walking, not elsewhere classified  Muscle weakness (generalized)  ONSET DATE: DOS 03/15/24   SUBJECTIVE:                                                                                                                                                                                           SUBJECTIVE STATEMENT: Insidious onset this year with pain on hip that spread into  groin. Tried working with training, chiropractor, PT but still had pain. Some knee discomfort but overall pretty good. Denies N/T.   PERTINENT HISTORY:  N/a  PAIN:  Are you having pain? Yes: NPRS scale: 3 Pain location: Lt hip Pain description: sore Aggravating factors: nothing specific Relieving factors: ice  PRECAUTIONS:  None  RED FLAGS: None   WEIGHT BEARING RESTRICTIONS:  WBAT  FALLS:  Has patient fallen in last 6 months? 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  PLOF:  Independent  PATIENT GOALS:  Daily workouts- walking, barre 3, kickboxing, working with a trainer; improve hip stability   OBJECTIVE:  Note: Objective measures were completed at Evaluation unless otherwise noted.  PATIENT SURVEYS:  LEFS  Extreme difficulty/unable (0), Quite a bit of difficulty (1), Moderate difficulty (2), Little difficulty (3), No difficulty (4) Survey date:  03/17/24  Any of your usual work, housework or school activities 3  2. Usual hobbies, recreational or sporting activities 1  3. Getting into/out of the bath 2  4. Walking between rooms 4  5. Putting on socks/shoes 3  6. Squatting  2  7. Lifting an object, like a bag of groceries from the floor 2  8. Performing light activities around your  home 3  9. Performing heavy activities around your home 1  10. Getting into/out of a car 2  11. Walking 2 blocks 2  12. Walking 1 mile 1  13. Going up/down 10 stairs (1 flight) 1  14. Standing for 1 hour 1  15.  sitting for 1 hour 2  16. Running on even ground 0  17. Running on uneven ground 0  18. Making sharp turns while running fast 0  19. Hopping  0  20. Rolling over in bed 2  Score total:  32/80     COGNITIVE STATUS: Within functional limits for tasks assessed   SENSATION: WFL  EDEMA:  No   GAIT: Comments: EVAL  arrived without AD, lacking hip ext as expected, upright posture, good heel-toe pattern.    Body Part #1 Hip  PALPATION: EVAL: soft end feel without increase in pain  LOWER EXTREMITY ROM:     Passive  Left eval  Hip flexion 90  Hip extension 0   (Blank rows = not tested)   TREATMENT DATE:   EVAL Changed bandages See HEP   PATIENT EDUCATION:  Education details: Anatomy of condition, POC, HEP, exercise form/rationale Person educated: Patient Education method: Explanation, Demonstration, Tactile cues, Verbal cues, and Handouts Education comprehension: verbalized understanding, returned demonstration, verbal cues required, tactile cues required, and needs further education    HOME EXERCISE PROGRAM: Access Code: [email or web address] URL: [email or web address]   ASSESSMENT:  CLINICAL IMPRESSION: Patient is a 38 y.o. F who was seen today for physical therapy evaluation and treatment for s/p Lt hip labral repair.      REHAB POTENTIAL: Good  CLINICAL DECISION MAKING: Stable/uncomplicated  EVALUATION COMPLEXITY: Low   GOALS: Goals reviewed with patient? Yes  SHORT TERM GOALS: Target date: 4 weeks 04/09/24  Pain free flexion to 90 Baseline: Goal status: INITIAL   2.  Demo controlled quad set with hold Baseline:  Goal status: INITIAL   3.  30s sit to stand without pain or compensation Baseline:  Goal status: INITIAL    4.  SLS 30s without compensation Baseline:  Goal status: INITIAL  LONG TERM GOALS:  Able to demo step up on 6 step with level pelvis and proper form Baseline:  Goal status: INITIAL Week 8 05/07/24  2.  Will tolerate at least 3 min on elliptical, demonstrating good tolerance to repetitive weight bearing motion Baseline:  Goal status: INITIAL  Week 8 05/07/24  3.  Demonstrate proper form in at least 10 continuous lunges without increased pain Baseline:  Goal status: INITIAL  Week 12 06/04/24  4.  Demonstrate gentle, double and single foot plyometric motions with good proximal form Baseline:  Goal status: INITIAL Week 12 06/04/24  5.  Demonstrate stability for return to barre exercises without limitation by pain Baseline:  Goal status: INITIAL     PLAN:  PT FREQUENCY: 1x/week  PT DURATION: POC date  PLANNED INTERVENTIONS: 97164- PT Re-evaluation, 97750- Physical Performance Testing, 97110-Therapeutic exercises, 97530- Therapeutic activity, 97112- Neuromuscular re-education, 97535- Self Care, 02859- Manual therapy, [id number]- Gait training, [phone]- Aquatic Therapy, [phone] (1-2 muscles), 20561 (3+ muscles)- Dry Needling, Patient/Family education, Balance training, Stair training, Taping, Joint mobilization, Spinal mobilization, Scar mobilization, and Cryotherapy.  PLAN FOR NEXT SESSION: per protocol, WBAT   Tyqwan Pink, PT, DPT 03/17/2024, 5:06 PM

## 2024-03-24 ENCOUNTER — Ambulatory Visit (HOSPITAL_BASED_OUTPATIENT_CLINIC_OR_DEPARTMENT_OTHER): Admitting: Physical Therapy

## 2024-03-24 ENCOUNTER — Encounter (HOSPITAL_BASED_OUTPATIENT_CLINIC_OR_DEPARTMENT_OTHER): Payer: Self-pay | Admitting: Physical Therapy

## 2024-03-24 DIAGNOSIS — M6281 Muscle weakness (generalized): Secondary | ICD-10-CM

## 2024-03-24 DIAGNOSIS — S73192A Other sprain of left hip, initial encounter: Secondary | ICD-10-CM | POA: Diagnosis not present

## 2024-03-24 DIAGNOSIS — M25552 Pain in left hip: Secondary | ICD-10-CM

## 2024-03-24 DIAGNOSIS — R262 Difficulty in walking, not elsewhere classified: Secondary | ICD-10-CM

## 2024-03-24 NOTE — Therapy (Addendum)
 OUTPATIENT PHYSICAL THERAPY TREATMENT   Patient Name: Kristine Graves MRN: [id number] DOB:01-23-1986, 38 y.o., female Today's Date: 03/24/2024  END OF SESSION:  PT End of Session - 03/24/24 0849     Visit Number 2    Number of Visits 13    Date for Recertification  06/11/24    Authorization Type BCBS    PT Start Time 0849    PT Stop Time 0928    PT Time Calculation (min) 39 min    Activity Tolerance Patient tolerated treatment well    Behavior During Therapy [facility] for tasks assessed/performed          Past Medical History:  Diagnosis Date   Abnormal maternal serum screening test 06/07/2015   1:39 Down syndrome risk from first trimester screening- NIPS drawn 06/07/15     Acute appendicitis 06/09/2019   Anxiety    Blood transfusion without reported diagnosis    at birth RH incompatibility   Depression    Elevated liver enzymes 11/30/2021   High risk pregnancy with low PAPPA 06/07/2015   History of appendectomy 11/30/2021   Hypercalcemia 11/30/2021   Hyponatremia 11/30/2021   Indication for care in labor or delivery 07/13/2018   Metabolic acidosis, increased anion gap 11/30/2021   Polycythemia 11/30/2021   SBO (small bowel obstruction) (HCC) 11/30/2021   SVD (spontaneous vaginal delivery) 07/13/2018   Past Surgical History:  Procedure Laterality Date   BREAST ENHANCEMENT SURGERY     LAPAROSCOPIC APPENDECTOMY N/A 06/09/2019   Procedure: APPENDECTOMY LAPAROSCOPIC;  Surgeon: Tanda Locus, MD;  Location: [facility] OR;  Service: General;  Laterality: N/A;   NASAL SINUS SURGERY     Patient Active Problem List   Diagnosis Date Noted   Tear of left acetabular labrum 03/15/2024   Alcohol use disorder, severe, in early remission (HCC) 06/29/2023   Generalized anxiety disorder 06/29/2023   Recurrent major depressive disorder, in remission 11/30/2021     REFERRING PROVIDER:  Genelle Standing, MD    REFERRING DIAG:  [phone] (ICD-10-CM) - Tear of left acetabular labrum,  initial encounter     S/p PROCEDURE: 1. Left hip labral repair  Rationale for Evaluation and Treatment: Rehabilitation  THERAPY DIAG:  Pain in left hip  Muscle weakness (generalized)  Difficulty in walking, not elsewhere classified  ONSET DATE: DOS 03/15/24   SUBJECTIVE:                                                                                                                                                                                           SUBJECTIVE STATEMENT: Starting to feel pain she had before which is worrying her a bit.  PERTINENT HISTORY:  N/a  PAIN:  Are you having pain? Yes: NPRS scale: 3 Pain location: Lt hip Pain description: sore Aggravating factors: nothing specific Relieving factors: ice  PRECAUTIONS:  None  RED FLAGS: None   WEIGHT BEARING RESTRICTIONS:  WBAT  FALLS:  Has patient fallen in last 6 months? 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  PLOF:  Independent  PATIENT GOALS:  Daily workouts- walking, barre 3, kickboxing, working with a trainer; improve hip stability   OBJECTIVE:  Note: Objective measures were completed at Evaluation unless otherwise noted.  PATIENT SURVEYS:  LEFS  Extreme difficulty/unable (0), Quite a bit of difficulty (1), Moderate difficulty (2), Little difficulty (3), No difficulty (4) Survey date:  03/17/24  Any of your usual work, housework or school activities 3  2. Usual hobbies, recreational or sporting activities 1  3. Getting into/out of the bath 2  4. Walking between rooms 4  5. Putting on socks/shoes 3  6. Squatting  2  7. Lifting an object, like a bag of groceries from the floor 2  8. Performing light activities around your home 3  9. Performing heavy activities around your home 1  10. Getting into/out of a car 2  11. Walking 2 blocks 2  12.  Walking 1 mile 1  13. Going up/down 10 stairs (1 flight) 1  14. Standing for 1 hour 1  15.  sitting for 1 hour 2  16. Running on even ground 0  17. Running on uneven ground 0  18. Making sharp turns while running fast 0  19. Hopping  0  20. Rolling over in bed 2  Score total:  32/80     COGNITIVE STATUS: Within functional limits for tasks assessed   SENSATION: WFL  EDEMA:  No   GAIT: Comments: EVAL  arrived without AD, lacking hip ext as expected, upright posture, good heel-toe pattern.    Body Part #1 Hip  PALPATION: EVAL: soft end feel without increase in pain  LOWER EXTREMITY ROM:     Passive  Left eval  Hip flexion 90  Hip extension 0   (Blank rows = not tested)   TREATMENT DATE:  03/24/24 Bandage change  Adductor squeezes 2x10  Glute bridges 3x10 Side lying hip abduction 3x10 Prone hip extension 3x10 Quadruped rocking x10   EVAL Changed bandages See HEP   PATIENT EDUCATION:  Education details: Teacher, Music of condition, POC, HEP, exercise form/rationale Person educated: Patient Education method: Explanation, Demonstration, Tactile cues, Verbal cues, and Handouts Education comprehension: verbalized understanding, returned demonstration, verbal cues required, tactile cues required, and needs further education    HOME EXERCISE PROGRAM: Access Code: [email or web address] URL: [email or web address]   ASSESSMENT:  CLINICAL IMPRESSION: Tolerated exercises well with no significant increase in pain. Utilized manual therapy to address hyperactive musculature in iliopsoas, quad muscles, and ITB. Exercises focused on glute strengthening within tolerance and protocol. Educated on activity modification at home and updated HEP. Educated on iliopsoas tightness and self-STM at home to alleviate pain. Will continue to benefit from therapy to address remaining limitations.    REHAB POTENTIAL: Good  CLINICAL DECISION MAKING: Stable/uncomplicated  EVALUATION  COMPLEXITY: Low   GOALS: Goals reviewed with patient? Yes  SHORT TERM GOALS: Target date: 4 weeks 04/09/24  Pain free flexion to 90 Baseline: Goal status: INITIAL   2.  Demo controlled quad  set with hold Baseline:  Goal status: INITIAL   3.  30s sit to stand without pain or compensation Baseline:  Goal status: INITIAL   4.  SLS 30s without compensation Baseline:  Goal status: INITIAL    LONG TERM GOALS:  Able to demo step up on 6 step with level pelvis and proper form Baseline:  Goal status: INITIAL Week 8 05/07/24  2.  Will tolerate at least 3 min on elliptical, demonstrating good tolerance to repetitive weight bearing motion Baseline:  Goal status: INITIAL  Week 8 05/07/24  3.  Demonstrate proper form in at least 10 continuous lunges without increased pain Baseline:  Goal status: INITIAL  Week 12 06/04/24  4.  Demonstrate gentle, double and single foot plyometric motions with good proximal form Baseline:  Goal status: INITIAL Week 12 06/04/24  5.  Demonstrate stability for return to barre exercises without limitation by pain Baseline:  Goal status: INITIAL     PLAN:  PT FREQUENCY: 1x/week  PT DURATION: POC date  PLANNED INTERVENTIONS: 97164- PT Re-evaluation, 97750- Physical Performance Testing, 97110-Therapeutic exercises, 97530- Therapeutic activity, 97112- Neuromuscular re-education, 97535- Self Care, 02859- Manual therapy, [id number]- Gait training, [phone]- Aquatic Therapy, [phone] (1-2 muscles), 20561 (3+ muscles)- Dry Needling, Patient/Family education, Balance training, Stair training, Taping, Joint mobilization, Spinal mobilization, Scar mobilization, and Cryotherapy.  PLAN FOR NEXT SESSION: per protocol, WBAT   Lili Finder, Student-PT 03/24/2024, 9:28 AM  This entire session was performed under direct supervision and direction of a licensed therapist/therapist assistant . I have personally read, edited and approve of the note as written. 9:36 AM,  03/24/24 Prentice CANDIE Stains PT, DPT Physical Therapist at [facility]

## 2024-03-30 ENCOUNTER — Ambulatory Visit (INDEPENDENT_AMBULATORY_CARE_PROVIDER_SITE_OTHER): Admitting: Orthopaedic Surgery

## 2024-03-30 ENCOUNTER — Encounter (HOSPITAL_BASED_OUTPATIENT_CLINIC_OR_DEPARTMENT_OTHER): Payer: Self-pay | Admitting: Physical Therapy

## 2024-03-30 ENCOUNTER — Ambulatory Visit (HOSPITAL_BASED_OUTPATIENT_CLINIC_OR_DEPARTMENT_OTHER): Admitting: Physical Therapy

## 2024-03-30 DIAGNOSIS — M25552 Pain in left hip: Secondary | ICD-10-CM

## 2024-03-30 DIAGNOSIS — R262 Difficulty in walking, not elsewhere classified: Secondary | ICD-10-CM | POA: Diagnosis not present

## 2024-03-30 DIAGNOSIS — S73192A Other sprain of left hip, initial encounter: Secondary | ICD-10-CM

## 2024-03-30 DIAGNOSIS — M6281 Muscle weakness (generalized): Secondary | ICD-10-CM

## 2024-03-30 NOTE — Progress Notes (Signed)
 Post Operative Evaluation    Procedure/Date of Surgery: Left hip arthroscopy with labral repair 11/4  Interval History:   Presents today for follow-up of the left hip.  At this time she is doing extremely well.  Range of motion and strength are coming along nicely   PMH/PSH/Family History/Social History/Meds/Allergies:    Past Medical History:  Diagnosis Date   Abnormal maternal serum screening test 06/07/2015   1:39 Down syndrome risk from first trimester screening- NIPS drawn 06/07/15     Acute appendicitis 06/09/2019   Anxiety    Blood transfusion without reported diagnosis    at birth RH incompatibility   Depression    Elevated liver enzymes 11/30/2021   High risk pregnancy with low PAPPA 06/07/2015   History of appendectomy 11/30/2021   Hypercalcemia 11/30/2021   Hyponatremia 11/30/2021   Indication for care in labor or delivery 07/13/2018   Metabolic acidosis, increased anion gap 11/30/2021   Polycythemia 11/30/2021   SBO (small bowel obstruction) (HCC) 11/30/2021   SVD (spontaneous vaginal delivery) 07/13/2018   Past Surgical History:  Procedure Laterality Date   BREAST ENHANCEMENT SURGERY     LAPAROSCOPIC APPENDECTOMY N/A 06/09/2019   Procedure: APPENDECTOMY LAPAROSCOPIC;  Surgeon: Tanda Locus, MD;  Location: [facility] OR;  Service: General;  Laterality: N/A;   NASAL SINUS SURGERY     Social History   Socioeconomic History   Marital status: Married    Spouse name: Not on file   Number of children: Not on file   Years of education: Not on file   Highest education level: Not on file  Occupational History   Not on file  Tobacco Use   Smoking status: Never   Smokeless tobacco: Never  Vaping Use   Vaping status: Never Used  Substance and Sexual Activity   Alcohol use: Not Currently   Drug use: Never   Sexual activity: Not on file  Other Topics Concern   Not on file  Social History Narrative   Not on file   Social Drivers  of Health   Financial Resource Strain: Low Risk  (07/12/2018)   Overall Financial Resource Strain (CARDIA)    Difficulty of Paying Living Expenses: Not hard at all  Food Insecurity: No Food Insecurity (07/12/2018)   Hunger Vital Sign    Worried About Running Out of Food in the Last Year: Never true    Ran Out of Food in the Last Year: Never true  Transportation Needs: Unknown (07/12/2018)   PRAPARE - Administrator, Civil Service (Medical): No    Lack of Transportation (Non-Medical): Not on file  Physical Activity: Not on file  Stress: No Stress Concern Present (07/12/2018)   [employer] of Occupational Health - Occupational Stress Questionnaire    Feeling of Stress : Only a little  Social Connections: Not on file   Family History  Problem Relation Age of Onset   Hypertension Father    Graves' disease Maternal Grandmother    Diabetes Maternal Grandfather    Heart attack Paternal Grandfather    Diabetes Paternal Grandfather    No Known Allergies Current Outpatient Medications  Medication Sig Dispense Refill   acetaminophen  (TYLENOL ) 500 MG tablet You can take 1000 mg of Tylenol /acetaminophen  every 8 hours as needed for pain.  For the next 24-48hrs we recommend you  just take it on a schedule.  As pain improves you can go back to using it as needed.  You can buy this over-the-counter at any drugstore.  Do not exceed 4000 mg of Tylenol  per day it can harm your liver. 30 tablet 0   aspirin  EC 325 MG tablet Take 1 tablet (325 mg total) by mouth daily. 14 tablet 0   busPIRone  (BUSPAR ) 15 MG tablet Take 1 tablet (15 mg total) by mouth 2 (two) times daily. 180 tablet 0   DULoxetine  (CYMBALTA ) 60 MG capsule Take 1 capsule (60 mg total) by mouth daily. 90 capsule 0   ibuprofen  (ADVIL ) 200 MG tablet You can take 2 to 3 tablets every 6 hours as needed for pain.  This is your second pain control medication.  You can alternate this with plain Tylenol /acetaminophen .  You can buy this  over-the-counter at any drugstore.     levonorgestrel  (MIRENA ) 20 MCG/DAY IUD 1 each by Intrauterine route once.     Multiple Vitamins-Minerals (MULTIVITAMIN WITH MINERALS) tablet Take 1 tablet by mouth daily.     naltrexone  (DEPADE) 50 MG tablet Take 1 tablet (50 mg total) by mouth at bedtime. 90 tablet 0   oxyCODONE  (ROXICODONE ) 5 MG immediate release tablet Take 1 tablet (5 mg total) by mouth every 4 (four) hours as needed for severe pain (pain score 7-10) or breakthrough pain. 10 tablet 0   traZODone  (DESYREL ) 50 MG tablet Take 0.5-1 tablets (25-50 mg total) by mouth at bedtime as needed for sleep. 90 tablet 0   No current facility-administered medications for this visit.   No results found.  Review of Systems:   A ROS was performed including pertinent positives and negatives as documented in the HPI.   Musculoskeletal Exam:    unknown if currently breastfeeding.  Left hip incisions are well-appearing without erythema or drainage.  30 degrees internal/external rotation of the hip are not painful.  Excellent abduction strength nonantalgic gait  Imaging:      I personally reviewed and interpreted the radiographs.   Assessment:     2 weeks status post left hip labral repair doing extremely well.  At this time she will continue to progress to the protocol and I will plan to see her back in 4 weeks for reassessment  Plan :    - Return to clinic 4 weeks for reassessment      I personally saw and evaluated the patient, and participated in the management and treatment plan.  Elspeth Parker, MD Attending Physician, Orthopedic Surgery  This document was dictated using Dragon voice recognition software. A reasonable attempt at proof reading has been made to minimize errors.

## 2024-03-30 NOTE — Therapy (Addendum)
 OUTPATIENT PHYSICAL THERAPY TREATMENT   Patient Name: Kristine Graves MRN: [id number] DOB:1985-11-14, 38 y.o., female Today's Date: 03/30/2024  END OF SESSION:  PT End of Session - 03/30/24 1110     Visit Number 3    Number of Visits 13    Date for Recertification  06/11/24    Authorization Type BCBS    PT Start Time 1110   Late arrival - doctor appointment prior   PT Stop Time 1144    PT Time Calculation (min) 34 min    Activity Tolerance Patient tolerated treatment well    Behavior During Therapy [facility] for tasks assessed/performed          Past Medical History:  Diagnosis Date   Abnormal maternal serum screening test 06/07/2015   1:39 Down syndrome risk from first trimester screening- NIPS drawn 06/07/15     Acute appendicitis 06/09/2019   Anxiety    Blood transfusion without reported diagnosis    at birth RH incompatibility   Depression    Elevated liver enzymes 11/30/2021   High risk pregnancy with low PAPPA 06/07/2015   History of appendectomy 11/30/2021   Hypercalcemia 11/30/2021   Hyponatremia 11/30/2021   Indication for care in labor or delivery 07/13/2018   Metabolic acidosis, increased anion gap 11/30/2021   Polycythemia 11/30/2021   SBO (small bowel obstruction) (HCC) 11/30/2021   SVD (spontaneous vaginal delivery) 07/13/2018   Past Surgical History:  Procedure Laterality Date   BREAST ENHANCEMENT SURGERY     LAPAROSCOPIC APPENDECTOMY N/A 06/09/2019   Procedure: APPENDECTOMY LAPAROSCOPIC;  Surgeon: Tanda Locus, MD;  Location: [facility];  Service: General;  Laterality: N/A;   NASAL SINUS SURGERY     Patient Active Problem List   Diagnosis Date Noted   Tear of left acetabular labrum 03/15/2024   Alcohol use disorder, severe, in early remission (HCC) 06/29/2023   Generalized anxiety disorder 06/29/2023   Recurrent major depressive disorder, in remission 11/30/2021     REFERRING PROVIDER:  Genelle Standing, MD    REFERRING DIAG:  [phone]  (ICD-10-CM) - Tear of left acetabular labrum, initial encounter     S/p PROCEDURE: 1. Left hip labral repair  Rationale for Evaluation and Treatment: Rehabilitation  THERAPY DIAG:  Pain in left hip  Muscle weakness (generalized)  Difficulty in walking, not elsewhere classified  ONSET DATE: DOS 03/15/24   SUBJECTIVE:                                                                                                                                                                                           SUBJECTIVE STATEMENT: States she is doing well today.  Reports she feels about the same but feels more confident in what she has been doing. Not scared to do anything with her hip now.   PERTINENT HISTORY:  N/a  PAIN:  Are you having pain? Yes: NPRS scale: 3 Pain location: Lt hip Pain description: sore Aggravating factors: nothing specific Relieving factors: ice  PRECAUTIONS:  None  RED FLAGS: None   WEIGHT BEARING RESTRICTIONS:  WBAT  FALLS:  Has patient fallen in last 6 months? 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  PLOF:  Independent  PATIENT GOALS:  Daily workouts- walking, barre 3, kickboxing, working with a trainer; improve hip stability   OBJECTIVE:  Note: Objective measures were completed at Evaluation unless otherwise noted.  PATIENT SURVEYS:  LEFS  Extreme difficulty/unable (0), Quite a bit of difficulty (1), Moderate difficulty (2), Little difficulty (3), No difficulty (4) Survey date:  03/17/24  Any of your usual work, housework or school activities 3  2. Usual hobbies, recreational or sporting activities 1  3. Getting into/out of the bath 2  4. Walking between rooms 4  5. Putting on socks/shoes 3  6. Squatting  2  7. Lifting an object, like a bag of groceries from the floor 2  8. Performing light  activities around your home 3  9. Performing heavy activities around your home 1  10. Getting into/out of a car 2  11. Walking 2 blocks 2  12. Walking 1 mile 1  13. Going up/down 10 stairs (1 flight) 1  14. Standing for 1 hour 1  15.  sitting for 1 hour 2  16. Running on even ground 0  17. Running on uneven ground 0  18. Making sharp turns while running fast 0  19. Hopping  0  20. Rolling over in bed 2  Score total:  32/80     COGNITIVE STATUS: Within functional limits for tasks assessed   SENSATION: WFL  EDEMA:  No   GAIT: Comments: EVAL  arrived without AD, lacking hip ext as expected, upright posture, good heel-toe pattern.    Body Part #1 Hip  PALPATION: EVAL: soft end feel without increase in pain  LOWER EXTREMITY ROM:     Passive  Left eval  Hip flexion 90  Hip extension 0   (Blank rows = not tested)   TREATMENT DATE:  03/30/24 Life cycle bike x6 min  Glute bridges 2x10 Bird dogs 3x10 Tall kneeling on airex pad, row with blue TB x10  Half-kneeling D2 shoulder flexion with RTB x10, GTB 2x10 bilaterally  Tall kneeling palloff press blue TB 2x10 bilaterally  03/24/24 Bandage change  Adductor squeezes 2x10  Glute bridges 3x10 Side lying hip abduction 3x10 Prone hip extension 3x10 Quadruped rocking x10   EVAL Changed bandages See HEP   PATIENT EDUCATION:  Education details: Teacher, Music of condition, POC, HEP, exercise form/rationale Person educated: Patient Education method: Explanation, Demonstration, Tactile cues, Verbal cues, and Handouts Education comprehension: verbalized understanding, returned demonstration, verbal cues required, tactile cues required, and needs further education    HOME EXERCISE PROGRAM: Access Code: [email or web address] URL: [email or web address]   ASSESSMENT:  CLINICAL IMPRESSION: Tolerated exercises well with no significant increase in pain. Progressed to tall-kneeling/half-kneeling exercises to promote core  and glute activation, and patient tolerated very well.. Patient is continuing to progress nicely. Educated on activity modification at home and updated HEP.  Advised patient to continue use of upright bike at home as part of exercise routine. Will continue to benefit from therapy to address remaining limitations.    REHAB POTENTIAL: Good  CLINICAL DECISION MAKING: Stable/uncomplicated  EVALUATION COMPLEXITY: Low   GOALS: Goals reviewed with patient? Yes  SHORT TERM GOALS: Target date: 4 weeks 04/09/24  Pain free flexion to 90 Baseline: Goal status: INITIAL   2.  Demo controlled quad set with hold Baseline:  Goal status: INITIAL   3.  30s sit to stand without pain or compensation Baseline:  Goal status: INITIAL   4.  SLS 30s without compensation Baseline:  Goal status: INITIAL    LONG TERM GOALS:  Able to demo step up on 6 step with level pelvis and proper form Baseline:  Goal status: INITIAL Week 8 05/07/24  2.  Will tolerate at least 3 min on elliptical, demonstrating good tolerance to repetitive weight bearing motion Baseline:  Goal status: INITIAL  Week 8 05/07/24  3.  Demonstrate proper form in at least 10 continuous lunges without increased pain Baseline:  Goal status: INITIAL  Week 12 06/04/24  4.  Demonstrate gentle, double and single foot plyometric motions with good proximal form Baseline:  Goal status: INITIAL Week 12 06/04/24  5.  Demonstrate stability for return to barre exercises without limitation by pain Baseline:  Goal status: INITIAL     PLAN:  PT FREQUENCY: 1x/week  PT DURATION: POC date  PLANNED INTERVENTIONS: 97164- PT Re-evaluation, 97750- Physical Performance Testing, 97110-Therapeutic exercises, 97530- Therapeutic activity, 97112- Neuromuscular re-education, 97535- Self Care, 02859- Manual therapy, [id number]- Gait training, [phone]- Aquatic Therapy, [phone] (1-2 muscles), 20561 (3+ muscles)- Dry Needling, Patient/Family education, Balance  training, Stair training, Taping, Joint mobilization, Spinal mobilization, Scar mobilization, and Cryotherapy.  PLAN FOR NEXT SESSION: per protocol, WBAT   Lili Finder, Student-PT 03/30/2024, 11:44 AM  This entire session was performed under direct supervision and direction of a licensed therapist/therapist assistant . I have personally read, edited and approve of the note as written. 11:51 AM, 03/30/24 Prentice CANDIE Stains PT, DPT Physical Therapist at [facility]

## 2024-04-05 ENCOUNTER — Ambulatory Visit (HOSPITAL_BASED_OUTPATIENT_CLINIC_OR_DEPARTMENT_OTHER): Admitting: Physical Therapy

## 2024-04-05 ENCOUNTER — Encounter (HOSPITAL_BASED_OUTPATIENT_CLINIC_OR_DEPARTMENT_OTHER): Payer: Self-pay | Admitting: Physical Therapy

## 2024-04-05 DIAGNOSIS — R262 Difficulty in walking, not elsewhere classified: Secondary | ICD-10-CM

## 2024-04-05 DIAGNOSIS — M25552 Pain in left hip: Secondary | ICD-10-CM

## 2024-04-05 DIAGNOSIS — M6281 Muscle weakness (generalized): Secondary | ICD-10-CM

## 2024-04-05 DIAGNOSIS — S73192A Other sprain of left hip, initial encounter: Secondary | ICD-10-CM | POA: Diagnosis not present

## 2024-04-05 NOTE — Therapy (Signed)
 OUTPATIENT PHYSICAL THERAPY TREATMENT   Patient Name: Kristine Graves MRN: [id number] DOB:1985-12-12, 38 y.o., female Today's Date: 04/06/2024  END OF SESSION:  PT End of Session - 04/05/24 0857     Visit Number 4    Number of Visits 13    Date for Recertification  06/11/24    Authorization Type BCBS    PT Start Time 0848    PT Stop Time 0931    PT Time Calculation (min) 43 min    Activity Tolerance Patient tolerated treatment well    Behavior During Therapy [facility] for tasks assessed/performed           Past Medical History:  Diagnosis Date   Abnormal maternal serum screening test 06/07/2015   1:39 Down syndrome risk from first trimester screening- NIPS drawn 06/07/15     Acute appendicitis 06/09/2019   Anxiety    Blood transfusion without reported diagnosis    at birth RH incompatibility   Depression    Elevated liver enzymes 11/30/2021   High risk pregnancy with low PAPPA 06/07/2015   History of appendectomy 11/30/2021   Hypercalcemia 11/30/2021   Hyponatremia 11/30/2021   Indication for care in labor or delivery 07/13/2018   Metabolic acidosis, increased anion gap 11/30/2021   Polycythemia 11/30/2021   SBO (small bowel obstruction) (HCC) 11/30/2021   SVD (spontaneous vaginal delivery) 07/13/2018   Past Surgical History:  Procedure Laterality Date   BREAST ENHANCEMENT SURGERY     LAPAROSCOPIC APPENDECTOMY N/A 06/09/2019   Procedure: APPENDECTOMY LAPAROSCOPIC;  Surgeon: Tanda Locus, MD;  Location: [facility] OR;  Service: General;  Laterality: N/A;   NASAL SINUS SURGERY     Patient Active Problem List   Diagnosis Date Noted   Tear of left acetabular labrum 03/15/2024   Alcohol use disorder, severe, in early remission (HCC) 06/29/2023   Generalized anxiety disorder 06/29/2023   Recurrent major depressive disorder, in remission 11/30/2021     REFERRING PROVIDER:  Genelle Standing, MD    REFERRING DIAG:  [phone] (ICD-10-CM) - Tear of left acetabular labrum,  initial encounter     S/p PROCEDURE: 1. Left hip labral repair  Rationale for Evaluation and Treatment: Rehabilitation  THERAPY DIAG:  Pain in left hip  Muscle weakness (generalized)  Difficulty in walking, not elsewhere classified  ONSET DATE: DOS 03/15/24   SUBJECTIVE:                                                                                                                                                                                           SUBJECTIVE STATEMENT: Pt is 3 weeks s/p Left hip labral repair.  Pt denies  any adverse effects after prior treatment.  Pt has been using her bike at home.  Pt reports compliance with HEP.       PERTINENT HISTORY:  N/a  PAIN:  Are you having pain? Yes: NPRS scale: 2/10 Pain location: Lt lateral hip, L ITB Pain description: sore Aggravating factors: nothing specific Relieving factors: ice  PRECAUTIONS:  None  RED FLAGS: None   WEIGHT BEARING RESTRICTIONS:  WBAT  FALLS:  Has patient fallen in last 6 months? 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  PLOF:  Independent  PATIENT GOALS:  Daily workouts- walking, barre 3, kickboxing, working with a trainer; improve hip stability   OBJECTIVE:  Note: Objective measures were completed at Evaluation unless otherwise noted.  PATIENT SURVEYS:  LEFS  Extreme difficulty/unable (0), Quite a bit of difficulty (1), Moderate difficulty (2), Little difficulty (3), No difficulty (4) Survey date:  03/17/24  Any of your usual work, housework or school activities 3  2. Usual hobbies, recreational or sporting activities 1  3. Getting into/out of the bath 2  4. Walking between rooms 4  5. Putting on socks/shoes 3  6. Squatting  2  7. Lifting an object, like a bag of groceries from the floor 2  8. Performing light activities around your home  3  9. Performing heavy activities around your home 1  10. Getting into/out of a car 2  11. Walking 2 blocks 2  12. Walking 1 mile 1  13. Going up/down 10 stairs (1 flight) 1  14. Standing for 1 hour 1  15.  sitting for 1 hour 2  16. Running on even ground 0  17. Running on uneven ground 0  18. Making sharp turns while running fast 0  19. Hopping  0  20. Rolling over in bed 2  Score total:  32/80     COGNITIVE STATUS: Within functional limits for tasks assessed   SENSATION: WFL  EDEMA:  No   GAIT: Comments: EVAL  arrived without AD, lacking hip ext as expected, upright posture, good heel-toe pattern.    Body Part #1 Hip  PALPATION: EVAL: soft end feel without increase in pain  LOWER EXTREMITY ROM:     Passive  Left eval  Hip flexion 90  Hip extension 0   (Blank rows = not tested)   TREATMENT DATE:  11/25 Upright bike x 6 mins lvl 0  Pt received R hip PROM in flex, abduction, ER, and circumduction in supine and IR in prone per pt and tissue tolerance w/n protocol ranges in supine.    Prone knee flexion x20 Quadruped arm lifts with TrA 2x10 Supine bridge 2x10 Hooklying bent knee fall outs with TrA w/n protocol ranges 2x10     03/30/24 Life cycle bike x6 min  Glute bridges 2x10 Bird dogs 3x10 Tall kneeling on airex pad, row with blue TB x10  Half-kneeling D2 shoulder flexion with RTB x10, GTB 2x10 bilaterally  Tall kneeling palloff press blue TB 2x10 bilaterally  03/24/24 Bandage change  Adductor squeezes 2x10  Glute bridges 3x10 Side lying hip abduction 3x10 Prone hip extension 3x10 Quadruped rocking x10   EVAL Changed bandages See HEP   PATIENT EDUCATION:  Education details: Anatomy of condition, POC, HEP, exercise form/rationale Person educated: Patient Education method: Explanation, Demonstration, Tactile cues, Verbal cues, and Handouts Education comprehension: verbalized  understanding, returned demonstration, verbal cues required,  tactile cues required, and needs further education    HOME EXERCISE PROGRAM: Access Code: [email or web address] URL: [email or web address]   ASSESSMENT:  CLINICAL IMPRESSION: Pt is progressing well with mobility, gait, ROM, and protocol.  Pt performed exercises per protocol well without c/o's.  PT performed PROM per pt and tissue tolerance w/n protocol ranges and PT tolerated PROM well.  Pt responded well to treatment having no increased pain or c/o's after treatment and stated she feels better after treatment.  She should benefit from cont PT per protocol to address impairments and goals and improve overall function.    REHAB POTENTIAL: Good  CLINICAL DECISION MAKING: Stable/uncomplicated  EVALUATION COMPLEXITY: Low   GOALS: Goals reviewed with patient? Yes  SHORT TERM GOALS: Target date: 4 weeks 04/09/24  Pain free flexion to 90 Baseline: Goal status: INITIAL   2.  Demo controlled quad set with hold Baseline:  Goal status: INITIAL   3.  30s sit to stand without pain or compensation Baseline:  Goal status: INITIAL   4.  SLS 30s without compensation Baseline:  Goal status: INITIAL    LONG TERM GOALS:  Able to demo step up on 6 step with level pelvis and proper form Baseline:  Goal status: INITIAL Week 8 05/07/24  2.  Will tolerate at least 3 min on elliptical, demonstrating good tolerance to repetitive weight bearing motion Baseline:  Goal status: INITIAL  Week 8 05/07/24  3.  Demonstrate proper form in at least 10 continuous lunges without increased pain Baseline:  Goal status: INITIAL  Week 12 06/04/24  4.  Demonstrate gentle, double and single foot plyometric motions with good proximal form Baseline:  Goal status: INITIAL Week 12 06/04/24  5.  Demonstrate stability for return to barre exercises without limitation by pain Baseline:  Goal status: INITIAL     PLAN:  PT FREQUENCY: 1x/week  PT DURATION: POC date  PLANNED INTERVENTIONS: 97164- PT  Re-evaluation, 97750- Physical Performance Testing, 97110-Therapeutic exercises, 97530- Therapeutic activity, 97112- Neuromuscular re-education, 97535- Self Care, 02859- Manual therapy, [id number]- Gait training, [phone]- Aquatic Therapy, [phone] (1-2 muscles), 20561 (3+ muscles)- Dry Needling, Patient/Family education, Balance training, Stair training, Taping, Joint mobilization, Spinal mobilization, Scar mobilization, and Cryotherapy.  PLAN FOR NEXT SESSION: per protocol, WBAT   Leigh Minerva III PT, DPT 04/06/24 9:56 AM

## 2024-04-12 ENCOUNTER — Ambulatory Visit (HOSPITAL_BASED_OUTPATIENT_CLINIC_OR_DEPARTMENT_OTHER): Admitting: Physical Therapy

## 2024-04-12 ENCOUNTER — Encounter (HOSPITAL_BASED_OUTPATIENT_CLINIC_OR_DEPARTMENT_OTHER): Payer: Self-pay | Admitting: Physical Therapy

## 2024-04-12 DIAGNOSIS — R262 Difficulty in walking, not elsewhere classified: Secondary | ICD-10-CM | POA: Insufficient documentation

## 2024-04-12 DIAGNOSIS — M6281 Muscle weakness (generalized): Secondary | ICD-10-CM | POA: Diagnosis present

## 2024-04-12 DIAGNOSIS — M25552 Pain in left hip: Secondary | ICD-10-CM | POA: Insufficient documentation

## 2024-04-12 NOTE — Therapy (Addendum)
 OUTPATIENT PHYSICAL THERAPY TREATMENT   Patient Name: Kristine Graves MRN: [id number] DOB:10-Mar-1986, 38 y.o., female Today's Date: 04/12/2024  END OF SESSION:  PT End of Session - 04/12/24 0845     Visit Number 5    Number of Visits 13    Date for Recertification  06/11/24    Authorization Type BCBS    PT Start Time 0846    PT Stop Time 0926    PT Time Calculation (min) 40 min    Activity Tolerance Patient tolerated treatment well    Behavior During Therapy [facility] for tasks assessed/performed         Past Medical History:  Diagnosis Date   Abnormal maternal serum screening test 06/07/2015   1:39 Down syndrome risk from first trimester screening- NIPS drawn 06/07/15     Acute appendicitis 06/09/2019   Anxiety    Blood transfusion without reported diagnosis    at birth RH incompatibility   Depression    Elevated liver enzymes 11/30/2021   High risk pregnancy with low PAPPA 06/07/2015   History of appendectomy 11/30/2021   Hypercalcemia 11/30/2021   Hyponatremia 11/30/2021   Indication for care in labor or delivery 07/13/2018   Metabolic acidosis, increased anion gap 11/30/2021   Polycythemia 11/30/2021   SBO (small bowel obstruction) (HCC) 11/30/2021   SVD (spontaneous vaginal delivery) 07/13/2018   Past Surgical History:  Procedure Laterality Date   BREAST ENHANCEMENT SURGERY     LAPAROSCOPIC APPENDECTOMY N/A 06/09/2019   Procedure: APPENDECTOMY LAPAROSCOPIC;  Surgeon: Tanda Locus, MD;  Location: [facility] OR;  Service: General;  Laterality: N/A;   NASAL SINUS SURGERY     Patient Active Problem List   Diagnosis Date Noted   Tear of left acetabular labrum 03/15/2024   Alcohol use disorder, severe, in early remission (HCC) 06/29/2023   Generalized anxiety disorder 06/29/2023   Recurrent major depressive disorder, in remission 11/30/2021     REFERRING PROVIDER:  Genelle Standing, MD    REFERRING DIAG:  [phone] (ICD-10-CM) - Tear of left acetabular labrum, initial  encounter     S/p PROCEDURE: 1. Left hip labral repair  Rationale for Evaluation and Treatment: Rehabilitation  THERAPY DIAG:  Pain in left hip  Muscle weakness (generalized)  Difficulty in walking, not elsewhere classified  ONSET DATE: DOS 03/15/24   SUBJECTIVE:                                                                                                                                                                                           SUBJECTIVE STATEMENT: Pt is 4 weeks s/p Left hip labral repair.  Pt reports she is  doing well but has been more sore in back of hip recently, maybe due to the cold weather.   PERTINENT HISTORY:  N/a  PAIN:  Are you having pain? Yes: NPRS scale: 2/10 Pain location: Lt lateral hip, L ITB Pain description: sore Aggravating factors: nothing specific Relieving factors: ice  PRECAUTIONS:  None  RED FLAGS: None   WEIGHT BEARING RESTRICTIONS:  WBAT  FALLS:  Has patient fallen in last 6 months? 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  PLOF:  Independent  PATIENT GOALS:  Daily workouts- walking, barre 3, kickboxing, working with a trainer; improve hip stability   OBJECTIVE:  Note: Objective measures were completed at Evaluation unless otherwise noted.  PATIENT SURVEYS:  LEFS  Extreme difficulty/unable (0), Quite a bit of difficulty (1), Moderate difficulty (2), Little difficulty (3), No difficulty (4) Survey date:  03/17/24  Any of your usual work, housework or school activities 3  2. Usual hobbies, recreational or sporting activities 1  3. Getting into/out of the bath 2  4. Walking between rooms 4  5. Putting on socks/shoes 3  6. Squatting  2  7. Lifting an object, like a bag of groceries from the floor 2  8. Performing light activities around your home 3  9. Performing heavy  activities around your home 1  10. Getting into/out of a car 2  11. Walking 2 blocks 2  12. Walking 1 mile 1  13. Going up/down 10 stairs (1 flight) 1  14. Standing for 1 hour 1  15.  sitting for 1 hour 2  16. Running on even ground 0  17. Running on uneven ground 0  18. Making sharp turns while running fast 0  19. Hopping  0  20. Rolling over in bed 2  Score total:  32/80     COGNITIVE STATUS: Within functional limits for tasks assessed   SENSATION: WFL  EDEMA:  No   GAIT: Comments: EVAL  arrived without AD, lacking hip ext as expected, upright posture, good heel-toe pattern.    Body Part #1 Hip  PALPATION: EVAL: soft end feel without increase in pain  LOWER EXTREMITY ROM:     Passive  Left eval  Hip flexion 90  Hip extension 0   (Blank rows = not tested)   TREATMENT DATE:  04/12/24 Life cycle bike x5 min  STM and trigger point release to glutes  Mini squats 2x10 Standing HS curl 2x10 Supine active hamstring stretch 2x10 with 5 second holds  Lateral walks 2x5 laps at rail LAQ 5# weight 3x10   11/25 Upright bike x 6 mins lvl 0  Pt received R hip PROM in flex, abduction, ER, and circumduction in supine and IR in prone per pt and tissue tolerance w/n protocol ranges in supine.    Prone knee flexion x20 Quadruped arm lifts with TrA 2x10 Supine bridge 2x10 Hooklying bent knee fall outs with TrA w/n protocol ranges 2x10   03/30/24 Life cycle bike x6 min  Glute bridges 2x10 Bird dogs 3x10 Tall kneeling on airex pad, row with blue TB x10  Half-kneeling D2 shoulder flexion with RTB x10, GTB 2x10 bilaterally  Tall kneeling palloff press blue TB 2x10 bilaterally  03/24/24 Bandage change  Adductor squeezes 2x10  Glute bridges 3x10 Side lying hip abduction 3x10 Prone hip extension 3x10 Quadruped rocking x10  EVAL Changed bandages See HEP   PATIENT EDUCATION:  Education details: Anatomy of condition, POC, HEP, exercise form/rationale Person  educated: Patient Education method: Explanation, Demonstration, Tactile cues, Verbal cues, and Handouts Education comprehension: verbalized understanding, returned demonstration, verbal cues required, tactile cues required, and needs further education    HOME EXERCISE PROGRAM: Access Code: [email or web address] URL: [email or web address]   ASSESSMENT:  CLINICAL IMPRESSION: Pt is progressing well with mobility, gait, ROM, and protocol.  Tolerated exercises with no significant increase in pain. Manual utilized to decrease hyperactive glute musculature. Benefited from manual with decrease in pain and stiffness. Progressed exercises within protocol and patient is doing very well. Verbal cueing to avoid compensation with mini squats and monitoring body mechanics. Educated on HEP, biomechanics, and stretching at home. Advised to continue doing a lot of reps with exercises. Will continue to benefit from therapy to address remaining limitations.    REHAB POTENTIAL: Good  CLINICAL DECISION MAKING: Stable/uncomplicated  EVALUATION COMPLEXITY: Low   GOALS: Goals reviewed with patient? Yes  SHORT TERM GOALS: Target date: 4 weeks 04/09/24  Pain free flexion to 90 Baseline: Goal status: INITIAL   2.  Demo controlled quad set with hold Baseline:  Goal status: INITIAL   3.  30s sit to stand without pain or compensation Baseline:  Goal status: INITIAL   4.  SLS 30s without compensation Baseline:  Goal status: INITIAL    LONG TERM GOALS:  Able to demo step up on 6 step with level pelvis and proper form Baseline:  Goal status: INITIAL Week 8 05/07/24  2.  Will tolerate at least 3 min on elliptical, demonstrating good tolerance to repetitive weight bearing motion Baseline:  Goal status: INITIAL  Week 8 05/07/24  3.  Demonstrate proper form in at least 10 continuous lunges without increased pain Baseline:  Goal status: INITIAL  Week 12 06/04/24  4.  Demonstrate gentle, double  and single foot plyometric motions with good proximal form Baseline:  Goal status: INITIAL Week 12 06/04/24  5.  Demonstrate stability for return to barre exercises without limitation by pain Baseline:  Goal status: INITIAL     PLAN:  PT FREQUENCY: 1x/week  PT DURATION: POC date  PLANNED INTERVENTIONS: 97164- PT Re-evaluation, 97750- Physical Performance Testing, 97110-Therapeutic exercises, 97530- Therapeutic activity, 97112- Neuromuscular re-education, 97535- Self Care, 02859- Manual therapy, [id number]- Gait training, [phone]- Aquatic Therapy, [phone] (1-2 muscles), 20561 (3+ muscles)- Dry Needling, Patient/Family education, Balance training, Stair training, Taping, Joint mobilization, Spinal mobilization, Scar mobilization, and Cryotherapy.  PLAN FOR NEXT SESSION: per protocol, WBAT   Lili Finder, Student-PT 04/12/2024, 9:27 AM  This entire session was performed under direct supervision and direction of a licensed therapist/therapist assistant . I have personally read, edited and approve of the note as written. 9:41 AM, 04/12/24 Prentice CANDIE Stains PT, DPT Physical Therapist at [facility]

## 2024-04-14 ENCOUNTER — Encounter (HOSPITAL_BASED_OUTPATIENT_CLINIC_OR_DEPARTMENT_OTHER): Admitting: Physical Therapy

## 2024-04-18 DIAGNOSIS — Z13220 Encounter for screening for lipoid disorders: Secondary | ICD-10-CM | POA: Diagnosis not present

## 2024-04-18 DIAGNOSIS — Z0000 Encounter for general adult medical examination without abnormal findings: Secondary | ICD-10-CM | POA: Diagnosis not present

## 2024-04-19 ENCOUNTER — Ambulatory Visit (HOSPITAL_BASED_OUTPATIENT_CLINIC_OR_DEPARTMENT_OTHER): Admitting: Physical Therapy

## 2024-04-19 ENCOUNTER — Encounter (HOSPITAL_BASED_OUTPATIENT_CLINIC_OR_DEPARTMENT_OTHER): Payer: Self-pay | Admitting: Physical Therapy

## 2024-04-19 DIAGNOSIS — R262 Difficulty in walking, not elsewhere classified: Secondary | ICD-10-CM

## 2024-04-19 DIAGNOSIS — M25552 Pain in left hip: Secondary | ICD-10-CM

## 2024-04-19 DIAGNOSIS — M6281 Muscle weakness (generalized): Secondary | ICD-10-CM

## 2024-04-19 NOTE — Therapy (Signed)
 OUTPATIENT PHYSICAL THERAPY TREATMENT   Patient Name: Kristine Graves MRN: [id number] DOB:1985/09/04, 38 y.o., female Today's Date: 04/19/2024  END OF SESSION:  PT End of Session - 04/19/24 0856     Visit Number 6    Number of Visits 13    Date for Recertification  06/11/24    Authorization Type BCBS    PT Start Time 0848    PT Stop Time 0927    PT Time Calculation (min) 39 min    Activity Tolerance Patient tolerated treatment well    Behavior During Therapy [facility] for tasks assessed/performed          Past Medical History:  Diagnosis Date   Abnormal maternal serum screening test 06/07/2015   1:39 Down syndrome risk from first trimester screening- NIPS drawn 06/07/15     Acute appendicitis 06/09/2019   Anxiety    Blood transfusion without reported diagnosis    at birth RH incompatibility   Depression    Elevated liver enzymes 11/30/2021   High risk pregnancy with low PAPPA 06/07/2015   History of appendectomy 11/30/2021   Hypercalcemia 11/30/2021   Hyponatremia 11/30/2021   Indication for care in labor or delivery 07/13/2018   Metabolic acidosis, increased anion gap 11/30/2021   Polycythemia 11/30/2021   SBO (small bowel obstruction) (HCC) 11/30/2021   SVD (spontaneous vaginal delivery) 07/13/2018   Past Surgical History:  Procedure Laterality Date   BREAST ENHANCEMENT SURGERY     LAPAROSCOPIC APPENDECTOMY N/A 06/09/2019   Procedure: APPENDECTOMY LAPAROSCOPIC;  Surgeon: Tanda Locus, MD;  Location: [facility];  Service: General;  Laterality: N/A;   NASAL SINUS SURGERY     Patient Active Problem List   Diagnosis Date Noted   Tear of left acetabular labrum 03/15/2024   Alcohol use disorder, severe, in early remission (HCC) 06/29/2023   Generalized anxiety disorder 06/29/2023   Recurrent major depressive disorder, in remission 11/30/2021     REFERRING PROVIDER:  Genelle Standing, MD    REFERRING DIAG:  [phone] (ICD-10-CM) - Tear of left acetabular labrum,  initial encounter     S/p PROCEDURE: 1. Left hip labral repair  Rationale for Evaluation and Treatment: Rehabilitation  THERAPY DIAG:  Muscle weakness (generalized)  Pain in left hip  Difficulty in walking, not elsewhere classified  ONSET DATE: DOS 03/15/24   SUBJECTIVE:                                                                                                                                                                                           SUBJECTIVE STATEMENT:  Hip is feeling stiff from the cold weather, HS stretches from last  time were helpful. Woke up on the left side today so maybe that's making it stiff and sore this morning too.   PERTINENT HISTORY:  N/a  PAIN:  Are you having pain? Yes: NPRS scale: 2/10 Pain location: Lt lateral hip, L ITB, top of L glute  Pain description: sore/stiff  Aggravating factors: cold weather, sleeping on it, sitting or standing too long  Relieving factors: ice, rest, walking around/gentle movement   PRECAUTIONS:  None  RED FLAGS: None   WEIGHT BEARING RESTRICTIONS:  WBAT  FALLS:  Has patient fallen in last 6 months? 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  PLOF:  Independent  PATIENT GOALS:  Daily workouts- walking, barre 3, kickboxing, working with a trainer; improve hip stability   OBJECTIVE:  Note: Objective measures were completed at Evaluation unless otherwise noted.  PATIENT SURVEYS:  LEFS  Extreme difficulty/unable (0), Quite a bit of difficulty (1), Moderate difficulty (2), Little difficulty (3), No difficulty (4) Survey date:  03/17/24 04/19/24  Any of your usual work, housework or school activities 3   2. Usual hobbies, recreational or sporting activities 1   3. Getting into/out of the bath 2   4. Walking between rooms 4   5. Putting on socks/shoes 3   6.  Squatting  2   7. Lifting an object, like a bag of groceries from the floor 2   8. Performing light activities around your home 3   9. Performing heavy activities around your home 1   10. Getting into/out of a car 2   11. Walking 2 blocks 2   12. Walking 1 mile 1   13. Going up/down 10 stairs (1 flight) 1   14. Standing for 1 hour 1   15.  sitting for 1 hour 2   16. Running on even ground 0   17. Running on uneven ground 0   18. Making sharp turns while running fast 0   19. Hopping  0   20. Rolling over in bed 2   Score total:  32/80 66/80     COGNITIVE STATUS: Within functional limits for tasks assessed   SENSATION: WFL  EDEMA:  No   GAIT: Comments: EVAL  arrived without AD, lacking hip ext as expected, upright posture, good heel-toe pattern.    Body Part #1 Hip  PALPATION: EVAL: soft end feel without increase in pain  LOWER EXTREMITY ROM:     Passive  Left eval 04/19/24 L  Hip flexion 90 90 AROM no pain   Hip extension 0    (Blank rows = not tested)   TREATMENT DATE:    04/19/24  Scifit bike L3x8 minutes for w/u  Shuttle BLE press 75# 2x12 Shuttle L LE press 37# 2x12   Hip ROM, LEFS, SLS, 30s STS test, goals and education on progress with PT  Standing hip ABD yellow TB 2x10 Mini squats at bar yellow TB 2x10  Forward step ups 4 inch step x12 L  HS stretch with sheet 2x30 seconds B       04/12/24 Life cycle bike x5 min  STM and trigger point release to glutes  Mini squats 2x10 Standing HS curl 2x10 Supine active hamstring stretch 2x10 with 5 second holds  Lateral walks 2x5 laps at rail LAQ 5# weight 3x10   11/25 Upright bike x 6 mins  lvl 0  Pt received R hip PROM in flex, abduction, ER, and circumduction in supine and IR in prone per pt and tissue tolerance w/n protocol ranges in supine.    Prone knee flexion x20 Quadruped arm lifts with TrA 2x10 Supine bridge 2x10 Hooklying bent knee fall outs with TrA w/n protocol ranges 2x10    03/30/24 Life cycle bike x6 min  Glute bridges 2x10 Bird dogs 3x10 Tall kneeling on airex pad, row with blue TB x10  Half-kneeling D2 shoulder flexion with RTB x10, GTB 2x10 bilaterally  Tall kneeling palloff press blue TB 2x10 bilaterally  03/24/24 Bandage change  Adductor squeezes 2x10  Glute bridges 3x10 Side lying hip abduction 3x10 Prone hip extension 3x10 Quadruped rocking x10   EVAL Changed bandages See HEP   PATIENT EDUCATION:  Education details: Teacher, Music of condition, POC, HEP, exercise form/rationale Person educated: Patient Education method: Explanation, Demonstration, Tactile cues, Verbal cues, and Handouts Education comprehension: verbalized understanding, returned demonstration, verbal cues required, tactile cues required, and needs further education    HOME EXERCISE PROGRAM: Access Code: [email or web address] URL: [email or web address]   ASSESSMENT:  CLINICAL IMPRESSION:  Arrived today feeling well but a bit stiff from the cold. Updated LEFS and goals, otherwise progressed all interventions as per protocol. Tolerated all interventions well today.      REHAB POTENTIAL: Good  CLINICAL DECISION MAKING: Stable/uncomplicated  EVALUATION COMPLEXITY: Low   GOALS: Goals reviewed with patient? Yes  SHORT TERM GOALS: Target date: 4 weeks 04/09/24  Pain free flexion to 90 Baseline: Goal status: MET 12/9  2.  Demo controlled quad set with hold Baseline:  Goal status: MET 12/9  3.  30s sit to stand without pain or compensation Baseline:  Goal status: ONGOING 12/9- mild compensations and pain, 9 reps in 30 seconds   4.  SLS 30s without compensation Baseline:  Goal status: MET 12/9    LONG TERM GOALS:  Able to demo step up on 6 step with level pelvis and proper form Baseline:  Goal status: INITIAL Week 8 05/07/24  2.  Will tolerate at least 3 min on elliptical, demonstrating good tolerance to repetitive weight bearing motion Baseline:   Goal status: INITIAL  Week 8 05/07/24  3.  Demonstrate proper form in at least 10 continuous lunges without increased pain Baseline:  Goal status: INITIAL  Week 12 06/04/24  4.  Demonstrate gentle, double and single foot plyometric motions with good proximal form Baseline:  Goal status: INITIAL Week 12 06/04/24  5.  Demonstrate stability for return to barre exercises without limitation by pain Baseline:  Goal status: INITIAL     PLAN:  PT FREQUENCY: 1x/week  PT DURATION: POC date  PLANNED INTERVENTIONS: 97164- PT Re-evaluation, 97750- Physical Performance Testing, 97110-Therapeutic exercises, 97530- Therapeutic activity, 97112- Neuromuscular re-education, 97535- Self Care, 02859- Manual therapy, [id number]- Gait training, [phone]- Aquatic Therapy, [phone] (1-2 muscles), 20561 (3+ muscles)- Dry Needling, Patient/Family education, Balance training, Stair training, Taping, Joint mobilization, Spinal mobilization, Scar mobilization, and Cryotherapy.  PLAN FOR NEXT SESSION: per protocol, WBAT; now 5 weeks out as of 04/19/24   Josette Rough, PT, DPT 04/19/24 9:27 AM

## 2024-04-21 ENCOUNTER — Encounter (HOSPITAL_BASED_OUTPATIENT_CLINIC_OR_DEPARTMENT_OTHER): Admitting: Physical Therapy

## 2024-04-22 ENCOUNTER — Ambulatory Visit (INDEPENDENT_AMBULATORY_CARE_PROVIDER_SITE_OTHER): Admitting: Student

## 2024-04-22 DIAGNOSIS — F411 Generalized anxiety disorder: Secondary | ICD-10-CM | POA: Diagnosis not present

## 2024-04-22 DIAGNOSIS — F3340 Major depressive disorder, recurrent, in remission, unspecified: Secondary | ICD-10-CM | POA: Diagnosis not present

## 2024-04-22 DIAGNOSIS — F1021 Alcohol dependence, in remission: Secondary | ICD-10-CM

## 2024-04-22 MED ORDER — BUSPIRONE HCL 15 MG PO TABS: 15.0000 mg | ORAL_TABLET | Freq: Two times a day (BID) | ORAL | 0 refills | Status: AC

## 2024-04-22 MED ORDER — TRAZODONE HCL 50 MG PO TABS: 25.0000 mg | ORAL_TABLET | Freq: Every evening | ORAL | 0 refills | Status: AC | PRN

## 2024-04-22 MED ORDER — DULOXETINE HCL 40 MG PO CPEP: 40.0000 mg | ORAL_CAPSULE | Freq: Every day | ORAL | 0 refills | Status: AC

## 2024-04-22 MED ORDER — NALTREXONE HCL 50 MG PO TABS: 50.0000 mg | ORAL_TABLET | Freq: Every day | ORAL | 0 refills | Status: AC

## 2024-04-22 NOTE — Progress Notes (Signed)
 Psychiatric Follow Up Adult Assessment  Patient Identification: Kristine Graves MRN:  [id number] Date of Evaluation:  04/22/2024  Assessment:  Kristine Graves is a 38 y.o. female with a history of generalized anxiety disorder, alcohol use disorder who presents in person to [facility] as a follow up for medication management.  Based on patient's history and assessment on initial visit, her symptoms were consistent with alcohol use disorder in early remission, major depressive disorder, alcohol-induced mood disorder, and generalized anxiety disorder.    Patient remains abstinent from alcohol and reports continued stability in mood and anxiety. She has achieved 1 year of sobriety. She continues to take her medications as prescribed and has been able to maintain compliance with daily medication regiment. She continues to feel that her medications are helpful in management of her depression and anxiety. She denies alcohol craving and continues to feel that she has a strong social support through family, AA, therapist, and sponsor. Given ongoing stability, plan to slowly taper off medications starting with duloxetine .    Plan:  # Alcohol Use Disorder, in early remission Past medication trials:  Interventions: -- Last Use: December 2024 -- Continue abstaining from alcohol -- continue naltrexone  50 mg at bedtime  # Major Depressive Disorder, in remission # History of Postpartum Depression # History of alcohol induced mood disorder # Generalized Anxiety Disorder Past medication trials:  Status of problem: remission Interventions: -- decrease duloxetine  to 40 mg daily  -- Continue buspirone  to 15 bid -- continue trazodone  25-50 mg at bedtime prn  # MTHFR gene variant Past medication trials:  Interventions: -- continue MVI  Hx of macrocytic anemia --continue MVI supplementation  Return to care in 12 weeks  Patient was given contact information for  behavioral health clinic and was instructed to call 911 for emergencies.    Patient and plan of care will be discussed with the Attending MD,who agrees with the above statement and plan.   Subjective:  Chief Complaint: Medication Management  Interval History: Patient presents in person for follow up. She denies SI/HI/AVH.  She reports eating and sleeping well. She reports mood has been good and alcohol use disorder has been well-controlled. No acute complaints with current psychotropics. Patient feels medications are appropriate and helpful for her. She was amenable to decreasing duloxetine  to evaluate if needing such a high dose of psychotropics.   Past Psychiatric History:  Diagnoses: MDD, GAD, alcohol use disorder Medication trials: auvelity (ineffective), lorazepam  (sedating), lexapro, mirtazapine, bupropion Previous psychiatrist/therapist: Dr. Karolynn [facility] Hospitalizations: denies Suicide attempts: denies SIB: denies Hx of violence towards others: denies Current access to guns: denies  Substance Abuse History in the last 12 months:  Yes.  Attended [facility] in December 2024 and [facility] January 2025.  Past Medical History:  Past Medical History:  Diagnosis Date   Abnormal maternal serum screening test 06/07/2015   1:39 Down syndrome risk from first trimester screening- NIPS drawn 06/07/15     Acute appendicitis 06/09/2019   Anxiety    Blood transfusion without reported diagnosis    at birth RH incompatibility   Depression    Elevated liver enzymes 11/30/2021   High risk pregnancy with low PAPPA 06/07/2015   History of appendectomy 11/30/2021   Hypercalcemia 11/30/2021   Hyponatremia 11/30/2021   Indication for care in labor or delivery 07/13/2018   Metabolic acidosis, increased anion gap 11/30/2021   Polycythemia 11/30/2021   SBO (small bowel obstruction) (HCC) 11/30/2021   SVD (spontaneous vaginal  delivery) 07/13/2018    Past Surgical History:   Procedure Laterality Date   BREAST ENHANCEMENT SURGERY     LAPAROSCOPIC APPENDECTOMY N/A 06/09/2019   Procedure: APPENDECTOMY LAPAROSCOPIC;  Surgeon: Tanda Locus, MD;  Location: [facility] OR;  Service: General;  Laterality: N/A;   NASAL SINUS SURGERY       Family History:  Family History  Problem Relation Age of Onset   Hypertension Father    Graves' disease Maternal Grandmother    Diabetes Maternal Grandfather    Heart attack Paternal Grandfather    Diabetes Paternal Grandfather     Social History:   Academic/Vocational: full time employment Social History   Socioeconomic History   Marital status: Married    Spouse name: Not on file   Number of children: Not on file   Years of education: Not on file   Highest education level: Not on file  Occupational History   Not on file  Tobacco Use   Smoking status: Never   Smokeless tobacco: Never  Vaping Use   Vaping status: Never Used  Substance and Sexual Activity   Alcohol use: Not Currently   Drug use: Never   Sexual activity: Not on file  Other Topics Concern   Not on file  Social History Narrative   Not on file   Social Drivers of Health   Tobacco Use: Low Risk (04/19/2024)   Patient History    Smoking Tobacco Use: Never    Smokeless Tobacco Use: Never    Passive Exposure: Not on file  Financial Resource Strain: Not on file  Food Insecurity: Not on file  Transportation Needs: Not on file  Physical Activity: Not on file  Stress: Not on file  Social Connections: Not on file  Depression (EYV7-0): Not on file  Alcohol Screen: Not on file  Housing: Not on file  Utilities: Not on file  Health Literacy: Not on file    Additional Social History: updated  Allergies:  No Known Allergies  Current Medications: Current Outpatient Medications  Medication Sig Dispense Refill   acetaminophen  (TYLENOL ) 500 MG tablet You can take 1000 mg of Tylenol /acetaminophen  every 8 hours as needed for pain.  For the next 24-48hrs we  recommend you just take it on a schedule.  As pain improves you can go back to using it as needed.  You can buy this over-the-counter at any drugstore.  Do not exceed 4000 mg of Tylenol  per day it can harm your liver. 30 tablet 0   aspirin  EC 325 MG tablet Take 1 tablet (325 mg total) by mouth daily. 14 tablet 0   busPIRone  (BUSPAR ) 15 MG tablet Take 1 tablet (15 mg total) by mouth 2 (two) times daily. 180 tablet 0   DULoxetine  (CYMBALTA ) 60 MG capsule Take 1 capsule (60 mg total) by mouth daily. 90 capsule 0   ibuprofen  (ADVIL ) 200 MG tablet You can take 2 to 3 tablets every 6 hours as needed for pain.  This is your second pain control medication.  You can alternate this with plain Tylenol /acetaminophen .  You can buy this over-the-counter at any drugstore.     levonorgestrel  (MIRENA ) 20 MCG/DAY IUD 1 each by Intrauterine route once.     Multiple Vitamins-Minerals (MULTIVITAMIN WITH MINERALS) tablet Take 1 tablet by mouth daily.     naltrexone  (DEPADE) 50 MG tablet Take 1 tablet (50 mg total) by mouth at bedtime. 90 tablet 0   oxyCODONE  (ROXICODONE ) 5 MG immediate release tablet Take 1 tablet (5 mg  total) by mouth every 4 (four) hours as needed for severe pain (pain score 7-10) or breakthrough pain. 10 tablet 0   traZODone  (DESYREL ) 50 MG tablet Take 0.5-1 tablets (25-50 mg total) by mouth at bedtime as needed for sleep. 90 tablet 0   No current facility-administered medications for this visit.    ROS: Review of Systems   Objective:  Psychiatric Specialty Exam:  unknown if currently breastfeeding.There is no height or weight on file to calculate BMI.  General Appearance: Well Groomed  Eye Contact:  Good  Speech:  Clear and Coherent and Normal Rate  Volume:  Normal  Mood:  Euthymic  Affect:  Appropriate and Congruent  Thought Content: Logical   Suicidal Thoughts:  No  Homicidal Thoughts:  No  Thought Process:  Coherent, Goal Directed, and Linear  Orientation:  Full (Time, Place, and  Person)  Judgment:  Intact  Insight:  Good  Concentration:  Concentration: Good  Fund of Knowledge: Fair  Language: Fair  Psychomotor Activity:  Normal  Akathisia:  No  AIMS (if indicated): not done  Assets:  Communication Skills Desire for Improvement Financial Resources/Insurance Housing Intimacy Leisure Time Physical Health Resilience Social Support Talents/Skills Transportation Vocational/Educational  ADL's:  Intact  Cognition: WNL      PE: General: well-appearing; no acute distress  Pulm: no increased work of breathing on room air  Strength & Muscle Tone: within normal limits Neuro: no focal neurological deficits observed  Gait & Station: normal   Screenings:  PHQ2-9    Flowsheet Row US  MFM OB FOLLOW UP from 11[facility]02/[facility] in [facility]  PHQ-2 Total Score 0   Flowsheet Row Admission (Discharged) from 03/15/2024 in [facility] ED from 11/30/2021 in [facility] Emergency Department at [facility]  C-SSRS RISK CATEGORY No Risk No Risk     Prentice Espy, MD 12/12/20259:37 AM

## 2024-04-26 ENCOUNTER — Ambulatory Visit (HOSPITAL_BASED_OUTPATIENT_CLINIC_OR_DEPARTMENT_OTHER)

## 2024-04-28 ENCOUNTER — Ambulatory Visit (INDEPENDENT_AMBULATORY_CARE_PROVIDER_SITE_OTHER): Admitting: Orthopaedic Surgery

## 2024-04-28 DIAGNOSIS — S73192A Other sprain of left hip, initial encounter: Secondary | ICD-10-CM

## 2024-04-28 NOTE — Progress Notes (Signed)
 Post Operative Evaluation    Procedure/Date of Surgery: Left hip arthroscopy with labral repair 11/4  Interval History:   Presents today for follow-up of the left hip.  She is doing extremely well at this time.  Denies any pain   PMH/PSH/Family History/Social History/Meds/Allergies:    Past Medical History:  Diagnosis Date   Abnormal maternal serum screening test 06/07/2015   1:39 Down syndrome risk from first trimester screening- NIPS drawn 06/07/15     Acute appendicitis 06/09/2019   Anxiety    Blood transfusion without reported diagnosis    at birth RH incompatibility   Depression    Elevated liver enzymes 11/30/2021   High risk pregnancy with low PAPPA 06/07/2015   History of appendectomy 11/30/2021   Hypercalcemia 11/30/2021   Hyponatremia 11/30/2021   Indication for care in labor or delivery 07/13/2018   Metabolic acidosis, increased anion gap 11/30/2021   Polycythemia 11/30/2021   SBO (small bowel obstruction) (HCC) 11/30/2021   SVD (spontaneous vaginal delivery) 07/13/2018   Past Surgical History:  Procedure Laterality Date   BREAST ENHANCEMENT SURGERY     LAPAROSCOPIC APPENDECTOMY N/A 06/09/2019   Procedure: APPENDECTOMY LAPAROSCOPIC;  Surgeon: Tanda Locus, MD;  Location: [facility] OR;  Service: General;  Laterality: N/A;   NASAL SINUS SURGERY     Social History   Socioeconomic History   Marital status: Married    Spouse name: Not on file   Number of children: Not on file   Years of education: Not on file   Highest education level: Not on file  Occupational History   Not on file  Tobacco Use   Smoking status: Never   Smokeless tobacco: Never  Vaping Use   Vaping status: Never Used  Substance and Sexual Activity   Alcohol use: Not Currently   Drug use: Never   Sexual activity: Not on file  Other Topics Concern   Not on file  Social History Narrative   Not on file   Social Drivers of Health   Tobacco Use: Low Risk  (04/19/2024)   Patient History    Smoking Tobacco Use: Never    Smokeless Tobacco Use: Never    Passive Exposure: Not on file  Financial Resource Strain: Not on file  Food Insecurity: Not on file  Transportation Needs: Not on file  Physical Activity: Not on file  Stress: Not on file  Social Connections: Not on file  Depression (EYV7-0): Not on file  Alcohol Screen: Not on file  Housing: Not on file  Utilities: Not on file  Health Literacy: Not on file   Family History  Problem Relation Age of Onset   Hypertension Father    Yvone' disease Maternal Grandmother    Diabetes Maternal Grandfather    Heart attack Paternal Grandfather    Diabetes Paternal Grandfather    No Known Allergies Current Outpatient Medications  Medication Sig Dispense Refill   acetaminophen  (TYLENOL ) 500 MG tablet You can take 1000 mg of Tylenol /acetaminophen  every 8 hours as needed for pain.  For the next 24-48hrs we recommend you just take it on a schedule.  As pain improves you can go back to using it as needed.  You can buy this over-the-counter at any drugstore.  Do not exceed 4000 mg of Tylenol  per day it can harm your liver. 30 tablet  0   aspirin  EC 325 MG tablet Take 1 tablet (325 mg total) by mouth daily. 14 tablet 0   [START ON 05/12/2024] busPIRone  (BUSPAR ) 15 MG tablet Take 1 tablet (15 mg total) by mouth 2 (two) times daily. 180 tablet 0   [START ON 05/12/2024] DULoxetine  40 MG CPEP Take 1 capsule (40 mg total) by mouth daily. 60 capsule 0   ibuprofen  (ADVIL ) 200 MG tablet You can take 2 to 3 tablets every 6 hours as needed for pain.  This is your second pain control medication.  You can alternate this with plain Tylenol /acetaminophen .  You can buy this over-the-counter at any drugstore.     levonorgestrel  (MIRENA ) 20 MCG/DAY IUD 1 each by Intrauterine route once.     Multiple Vitamins-Minerals (MULTIVITAMIN WITH MINERALS) tablet Take 1 tablet by mouth daily.     [START ON 05/12/2024] naltrexone  (DEPADE) 50  MG tablet Take 1 tablet (50 mg total) by mouth at bedtime. 90 tablet 0   oxyCODONE  (ROXICODONE ) 5 MG immediate release tablet Take 1 tablet (5 mg total) by mouth every 4 (four) hours as needed for severe pain (pain score 7-10) or breakthrough pain. 10 tablet 0   [START ON 05/12/2024] traZODone  (DESYREL ) 50 MG tablet Take 0.5-1 tablets (25-50 mg total) by mouth at bedtime as needed for sleep. 90 tablet 0   No current facility-administered medications for this visit.   No results found.  Review of Systems:   A ROS was performed including pertinent positives and negatives as documented in the HPI.   Musculoskeletal Exam:    unknown if currently breastfeeding.  Left hip incisions are well-appearing without erythema or drainage.  30 degrees internal/external rotation of the hip are not painful.  Excellent abduction strength nonantalgic gait  Imaging:      I personally reviewed and interpreted the radiographs.   Assessment:     6 weeks status post left hip labral repair doing extremely well.  This time she will continue to ramp up activity and I will plan to see her back as needed  Plan :    - Return to clinic as needed      I personally saw and evaluated the patient, and participated in the management and treatment plan.  Elspeth Parker, MD Attending Physician, Orthopedic Surgery  This document was dictated using Dragon voice recognition software. A reasonable attempt at proof reading has been made to minimize errors.

## 2024-05-03 ENCOUNTER — Encounter (HOSPITAL_BASED_OUTPATIENT_CLINIC_OR_DEPARTMENT_OTHER): Payer: Self-pay

## 2024-05-03 ENCOUNTER — Ambulatory Visit (HOSPITAL_BASED_OUTPATIENT_CLINIC_OR_DEPARTMENT_OTHER)

## 2024-05-03 DIAGNOSIS — R262 Difficulty in walking, not elsewhere classified: Secondary | ICD-10-CM

## 2024-05-03 DIAGNOSIS — M6281 Muscle weakness (generalized): Secondary | ICD-10-CM

## 2024-05-03 DIAGNOSIS — M25552 Pain in left hip: Secondary | ICD-10-CM | POA: Diagnosis not present

## 2024-05-03 NOTE — Therapy (Signed)
 " OUTPATIENT PHYSICAL THERAPY TREATMENT   Patient Name: Kristine Graves MRN: [id number] DOB:03-26-1986, 38 y.o., female Today's Date: 05/03/2024  END OF SESSION:  PT End of Session - 05/03/24 1027     Visit Number 7    Number of Visits 13    Date for Recertification  06/11/24    Authorization Type BCBS    PT Start Time 1025   pt arrived late   PT Stop Time 1103    PT Time Calculation (min) 38 min    Activity Tolerance Patient tolerated treatment well    Behavior During Therapy [facility] for tasks assessed/performed           Past Medical History:  Diagnosis Date   Abnormal maternal serum screening test 06/07/2015   1:39 Down syndrome risk from first trimester screening- NIPS drawn 06/07/15     Acute appendicitis 06/09/2019   Anxiety    Blood transfusion without reported diagnosis    at birth RH incompatibility   Depression    Elevated liver enzymes 11/30/2021   High risk pregnancy with low PAPPA 06/07/2015   History of appendectomy 11/30/2021   Hypercalcemia 11/30/2021   Hyponatremia 11/30/2021   Indication for care in labor or delivery 07/13/2018   Metabolic acidosis, increased anion gap 11/30/2021   Polycythemia 11/30/2021   SBO (small bowel obstruction) (HCC) 11/30/2021   SVD (spontaneous vaginal delivery) 07/13/2018   Past Surgical History:  Procedure Laterality Date   BREAST ENHANCEMENT SURGERY     LAPAROSCOPIC APPENDECTOMY N/A 06/09/2019   Procedure: APPENDECTOMY LAPAROSCOPIC;  Surgeon: Tanda Locus, MD;  Location: [facility] OR;  Service: General;  Laterality: N/A;   NASAL SINUS SURGERY     Patient Active Problem List   Diagnosis Date Noted   Tear of left acetabular labrum 03/15/2024   Alcohol use disorder, severe, in early remission (HCC) 06/29/2023   Generalized anxiety disorder 06/29/2023   Recurrent major depressive disorder, in remission 11/30/2021     REFERRING PROVIDER:  Genelle Standing, MD    REFERRING DIAG:  [phone] (ICD-10-CM) - Tear of left  acetabular labrum, initial encounter     S/p PROCEDURE: 1. Left hip labral repair  Rationale for Evaluation and Treatment: Rehabilitation  THERAPY DIAG:  Muscle weakness (generalized)  Pain in left hip  Difficulty in walking, not elsewhere classified  ONSET DATE: DOS 03/15/24   SUBJECTIVE:                                                                                                                                                                                           SUBJECTIVE STATEMENT:  Pt reports she tried a kickboxing  video which went well. She denies pain at entry. Saw MD last week who is pleased with progress. Has bene walking 3 miles in her neighborhood which has bene going well.   PERTINENT HISTORY:  N/a  PAIN:  Are you having pain? No, rest, walking around/gentle movement   PRECAUTIONS:  None  RED FLAGS: None   WEIGHT BEARING RESTRICTIONS:  WBAT  FALLS:  Has patient fallen in last 6 months? 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  PLOF:  Independent  PATIENT GOALS:  Daily workouts- walking, barre 3, kickboxing, working with a trainer; improve hip stability   OBJECTIVE:  Note: Objective measures were completed at Evaluation unless otherwise noted.  PATIENT SURVEYS:  LEFS  Extreme difficulty/unable (0), Quite a bit of difficulty (1), Moderate difficulty (2), Little difficulty (3), No difficulty (4) Survey date:  03/17/24 04/19/24  Any of your usual work, housework or school activities 3   2. Usual hobbies, recreational or sporting activities 1   3. Getting into/out of the bath 2   4. Walking between rooms 4   5. Putting on socks/shoes 3   6. Squatting  2   7. Lifting an object, like a bag of groceries from the floor 2   8. Performing light activities around your home 3   9. Performing heavy activities  around your home 1   10. Getting into/out of a car 2   11. Walking 2 blocks 2   12. Walking 1 mile 1   13. Going up/down 10 stairs (1 flight) 1   14. Standing for 1 hour 1   15.  sitting for 1 hour 2   16. Running on even ground 0   17. Running on uneven ground 0   18. Making sharp turns while running fast 0   19. Hopping  0   20. Rolling over in bed 2   Score total:  32/80 66/80     COGNITIVE STATUS: Within functional limits for tasks assessed   SENSATION: WFL  EDEMA:  No   GAIT: Comments: EVAL  arrived without AD, lacking hip ext as expected, upright posture, good heel-toe pattern.    Body Part #1 Hip  PALPATION: EVAL: soft end feel without increase in pain  LOWER EXTREMITY ROM:     Passive  Left eval 04/19/24 L  Hip flexion 90 90 AROM no pain   Hip extension 0    (Blank rows = not tested)   TREATMENT DATE:   12/23 Sci-fit L4 x41min Long sit HSS 30sec x3 PROM L hip Cook bridge 2x10 L HSC with red physioball 2x10 S/l hip abduction taps ant/post 2x15ea    04/19/24  Scifit bike L3x8 minutes for w/u  Shuttle BLE press 75# 2x12 Shuttle L LE press 37# 2x12   Hip ROM, LEFS, SLS, 30s STS test, goals and education on progress with PT  Standing hip ABD yellow TB 2x10 Mini squats at bar yellow TB 2x10  Forward step ups 4 inch step x12 L  HS stretch with sheet 2x30 seconds B       04/12/24 Life cycle bike x5 min  STM and trigger point release to glutes  Mini squats 2x10 Standing HS curl 2x10 Supine active hamstring stretch 2x10 with 5 second holds  Lateral walks 2x5 laps at rail LAQ 5# weight 3x10   11/25 Upright  bike x 6 mins lvl 0  Pt received R hip PROM in flex, abduction, ER, and circumduction in supine and IR in prone per pt and tissue tolerance w/n protocol ranges in supine.    Prone knee flexion x20 Quadruped arm lifts with TrA 2x10 Supine bridge 2x10 Hooklying bent knee fall outs with TrA w/n protocol ranges 2x10   03/30/24 Life  cycle bike x6 min  Glute bridges 2x10 Bird dogs 3x10 Tall kneeling on airex pad, row with blue TB x10  Half-kneeling D2 shoulder flexion with RTB x10, GTB 2x10 bilaterally  Tall kneeling palloff press blue TB 2x10 bilaterally  03/24/24 Bandage change  Adductor squeezes 2x10  Glute bridges 3x10 Side lying hip abduction 3x10 Prone hip extension 3x10 Quadruped rocking x10   EVAL Changed bandages See HEP   PATIENT EDUCATION:  Education details: Teacher, Music of condition, POC, HEP, exercise form/rationale Person educated: Patient Education method: Explanation, Demonstration, Tactile cues, Verbal cues, and Handouts Education comprehension: verbalized understanding, returned demonstration, verbal cues required, tactile cues required, and needs further education    HOME EXERCISE PROGRAM: Access Code: [email or web address] URL: [email or web address]   ASSESSMENT:  CLINICAL IMPRESSION:  Pt 7 weeks s/p today. Good tolerance for progressions to strengthening interventions. Pt fatigued in hip musculature with cook bridges and s/l abduction. Mild tightness into end range ER and abduction, but not significant. Verbally discussed progressions to HEP. Tx time limited due to late arrival.      REHAB POTENTIAL: Good  CLINICAL DECISION MAKING: Stable/uncomplicated  EVALUATION COMPLEXITY: Low   GOALS: Goals reviewed with patient? Yes  SHORT TERM GOALS: Target date: 4 weeks 04/09/24  Pain free flexion to 90 Baseline: Goal status: MET 12/9  2.  Demo controlled quad set with hold Baseline:  Goal status: MET 12/9  3.  30s sit to stand without pain or compensation Baseline:  Goal status: ONGOING 12/9- mild compensations and pain, 9 reps in 30 seconds   4.  SLS 30s without compensation Baseline:  Goal status: MET 12/9    LONG TERM GOALS:  Able to demo step up on 6 step with level pelvis and proper form Baseline:  Goal status: INITIAL Week 8 05/07/24  2.  Will tolerate  at least 3 min on elliptical, demonstrating good tolerance to repetitive weight bearing motion Baseline:  Goal status: INITIAL  Week 8 05/07/24  3.  Demonstrate proper form in at least 10 continuous lunges without increased pain Baseline:  Goal status: INITIAL  Week 12 06/04/24  4.  Demonstrate gentle, double and single foot plyometric motions with good proximal form Baseline:  Goal status: INITIAL Week 12 06/04/24  5.  Demonstrate stability for return to barre exercises without limitation by pain Baseline:  Goal status: INITIAL     PLAN:  PT FREQUENCY: 1x/week  PT DURATION: POC date  PLANNED INTERVENTIONS: 97164- PT Re-evaluation, 97750- Physical Performance Testing, 97110-Therapeutic exercises, 97530- Therapeutic activity, 97112- Neuromuscular re-education, 97535- Self Care, [id number]- Manual therapy, [id number]- Gait training, [phone]- Aquatic Therapy, [phone] (1-2 muscles), [id number] (3+ muscles)- Dry Needling, Patient/Family education, Balance training, Stair training, Taping, Joint mobilization, Spinal mobilization, Scar mobilization, and Cryotherapy.  PLAN FOR NEXT SESSION: per protocol, WBAT; now 5 weeks out as of 04/19/24    Asberry BRAVO Charrise Lardner, PTA 05/03/2024, 11:18 AM     "

## 2024-05-10 ENCOUNTER — Ambulatory Visit (HOSPITAL_BASED_OUTPATIENT_CLINIC_OR_DEPARTMENT_OTHER): Admitting: Physical Therapy

## 2024-05-10 ENCOUNTER — Encounter (HOSPITAL_BASED_OUTPATIENT_CLINIC_OR_DEPARTMENT_OTHER): Payer: Self-pay | Admitting: Physical Therapy

## 2024-05-10 DIAGNOSIS — M25552 Pain in left hip: Secondary | ICD-10-CM

## 2024-05-10 DIAGNOSIS — R262 Difficulty in walking, not elsewhere classified: Secondary | ICD-10-CM

## 2024-05-10 DIAGNOSIS — M6281 Muscle weakness (generalized): Secondary | ICD-10-CM

## 2024-05-10 NOTE — Therapy (Signed)
 " OUTPATIENT PHYSICAL THERAPY TREATMENT   Patient Name: Tracina Beaumont MRN: [id number] DOB:30-Jan-1986, 38 y.o., female Today's Date: 05/10/2024  END OF SESSION:  PT End of Session - 05/10/24 1020     Visit Number 8    Number of Visits 13    Date for Recertification  06/11/24    Authorization Type BCBS    Authorization Time Period 15 visits approved  From 11.06.2025 - 02.04.2026    Authorization - Visit Number 8    Authorization - Number of Visits 15    PT Start Time 1019    PT Stop Time 1059    PT Time Calculation (min) 40 min    Activity Tolerance Patient tolerated treatment well    Behavior During Therapy [facility] for tasks assessed/performed           Past Medical History:  Diagnosis Date   Abnormal maternal serum screening test 06/07/2015   1:39 Down syndrome risk from first trimester screening- NIPS drawn 06/07/15     Acute appendicitis 06/09/2019   Anxiety    Blood transfusion without reported diagnosis    at birth RH incompatibility   Depression    Elevated liver enzymes 11/30/2021   High risk pregnancy with low PAPPA 06/07/2015   History of appendectomy 11/30/2021   Hypercalcemia 11/30/2021   Hyponatremia 11/30/2021   Indication for care in labor or delivery 07/13/2018   Metabolic acidosis, increased anion gap 11/30/2021   Polycythemia 11/30/2021   SBO (small bowel obstruction) (HCC) 11/30/2021   SVD (spontaneous vaginal delivery) 07/13/2018   Past Surgical History:  Procedure Laterality Date   BREAST ENHANCEMENT SURGERY     LAPAROSCOPIC APPENDECTOMY N/A 06/09/2019   Procedure: APPENDECTOMY LAPAROSCOPIC;  Surgeon: Tanda Locus, MD;  Location: [facility] OR;  Service: General;  Laterality: N/A;   NASAL SINUS SURGERY     Patient Active Problem List   Diagnosis Date Noted   Tear of left acetabular labrum 03/15/2024   Alcohol use disorder, severe, in early remission (HCC) 06/29/2023   Generalized anxiety disorder 06/29/2023   Recurrent major depressive disorder,  in remission 11/30/2021     REFERRING PROVIDER:  Genelle Standing, MD    REFERRING DIAG:  [phone] (ICD-10-CM) - Tear of left acetabular labrum, initial encounter     S/p PROCEDURE: 1. Left hip labral repair  Rationale for Evaluation and Treatment: Rehabilitation  THERAPY DIAG:  Muscle weakness (generalized)  Pain in left hip  Difficulty in walking, not elsewhere classified  ONSET DATE: DOS 03/15/24   SUBJECTIVE:  SUBJECTIVE STATEMENT:  Pt reports doing workouts and HEP.    PERTINENT HISTORY:  N/a  PAIN:  Are you having pain? No, rest, walking around/gentle movement   PRECAUTIONS:  None  RED FLAGS: None   WEIGHT BEARING RESTRICTIONS:  WBAT  FALLS:  Has patient fallen in last 6 months? 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  PLOF:  Independent  PATIENT GOALS:  Daily workouts- walking, barre 3, kickboxing, working with a trainer; improve hip stability   OBJECTIVE:  Note: Objective measures were completed at Evaluation unless otherwise noted.  PATIENT SURVEYS:  LEFS  Extreme difficulty/unable (0), Quite a bit of difficulty (1), Moderate difficulty (2), Little difficulty (3), No difficulty (4) Survey date:  03/17/24 04/19/24  Any of your usual work, housework or school activities 3   2. Usual hobbies, recreational or sporting activities 1   3. Getting into/out of the bath 2   4. Walking between rooms 4   5. Putting on socks/shoes 3   6. Squatting  2   7. Lifting an object, like a bag of groceries from the floor 2   8. Performing light activities around your home 3   9. Performing heavy activities around your home 1   10. Getting  into/out of a car 2   11. Walking 2 blocks 2   12. Walking 1 mile 1   13. Going up/down 10 stairs (1 flight) 1   14. Standing for 1 hour 1   15.  sitting for 1 hour 2   16. Running on even ground 0   17. Running on uneven ground 0   18. Making sharp turns while running fast 0   19. Hopping  0   20. Rolling over in bed 2   Score total:  32/80 66/80     COGNITIVE STATUS: Within functional limits for tasks assessed   SENSATION: WFL  EDEMA:  No   GAIT: Comments: EVAL  arrived without AD, lacking hip ext as expected, upright posture, good heel-toe pattern.    Body Part #1 Hip  PALPATION: EVAL: soft end feel without increase in pain  LOWER EXTREMITY ROM:     Passive  Left eval 04/19/24 L  Hip flexion 90 90 AROM no pain   Hip extension 0    (Blank rows = not tested)   TREATMENT DATE:  05/10/24 Elliptical 5 minutes level 5 for dynamic warm up Lateral step down 6 inch 3 x 10 Forward step down 6 inch 2 x 10 Dead lift 10# kb 2 x 15 SL RDL 2 x 10 Mini squat 3 way slide out 2 x 5 Split squat 2 x 12   12/23 Sci-fit L4 x24min Long sit HSS 30sec x3 PROM L hip Cook bridge 2x10 L HSC with red physioball 2x10 S/l hip abduction taps ant/post 2x15ea   04/19/24  Scifit bike L3x8 minutes for w/u  Shuttle BLE press 75# 2x12 Shuttle L LE press 37# 2x12   Hip ROM, LEFS, SLS, 30s STS test, goals and education on progress with PT  Standing hip ABD yellow TB 2x10 Mini squats at bar yellow TB 2x10  Forward step ups 4 inch step x12 L  HS stretch with sheet 2x30 seconds B    04/12/24 Life cycle bike x5 min  STM and trigger point release to glutes  Mini squats 2x10  Standing HS curl 2x10 Supine active hamstring stretch 2x10 with 5 second holds  Lateral walks 2x5 laps at rail LAQ 5# weight 3x10   11/25 Upright bike x 6 mins lvl 0  Pt received R hip PROM in flex, abduction, ER, and circumduction in supine and IR in prone per pt and tissue tolerance w/n protocol ranges  in supine.    Prone knee flexion x20 Quadruped arm lifts with TrA 2x10 Supine bridge 2x10 Hooklying bent knee fall outs with TrA w/n protocol ranges 2x10   03/30/24 Life cycle bike x6 min  Glute bridges 2x10 Bird dogs 3x10 Tall kneeling on airex pad, row with blue TB x10  Half-kneeling D2 shoulder flexion with RTB x10, GTB 2x10 bilaterally  Tall kneeling palloff press blue TB 2x10 bilaterally  03/24/24 Bandage change  Adductor squeezes 2x10  Glute bridges 3x10 Side lying hip abduction 3x10 Prone hip extension 3x10 Quadruped rocking x10   EVAL Changed bandages See HEP   PATIENT EDUCATION:  Education details: Teacher, Music of condition, POC, HEP, exercise form/rationale Person educated: Patient Education method: Explanation, Demonstration, Tactile cues, Verbal cues, and Handouts Education comprehension: verbalized understanding, returned demonstration, verbal cues required, tactile cues required, and needs further education    HOME EXERCISE PROGRAM: Access Code: [email or web address] URL: [email or web address]   ASSESSMENT:  CLINICAL IMPRESSION:  Progressed into higher level exercise and more dynamic movements today which are tolerated well. Some cueing provided initially for reducing dynamic valgus with good carry over. Patient will continue to benefit from physical therapy in order to improve function and reduce impairment.     REHAB POTENTIAL: Good  CLINICAL DECISION MAKING: Stable/uncomplicated  EVALUATION COMPLEXITY: Low   GOALS: Goals reviewed with patient? Yes  SHORT TERM GOALS: Target date: 4 weeks 04/09/24  Pain free flexion to 90 Baseline: Goal status: MET 12/9  2.  Demo controlled quad set with hold Baseline:  Goal status: MET 12/9  3.  30s sit to stand without pain or compensation Baseline:  Goal status: ONGOING 12/9- mild compensations and pain, 9 reps in 30 seconds   4.  SLS 30s without compensation Baseline:  Goal status: MET 12/9     LONG TERM GOALS:  Able to demo step up on 6 step with level pelvis and proper form Baseline:  Goal status: INITIAL Week 8 05/07/24  2.  Will tolerate at least 3 min on elliptical, demonstrating good tolerance to repetitive weight bearing motion Baseline:  Goal status: INITIAL  Week 8 05/07/24  3.  Demonstrate proper form in at least 10 continuous lunges without increased pain Baseline:  Goal status: INITIAL  Week 12 06/04/24  4.  Demonstrate gentle, double and single foot plyometric motions with good proximal form Baseline:  Goal status: INITIAL Week 12 06/04/24  5.  Demonstrate stability for return to barre exercises without limitation by pain Baseline:  Goal status: INITIAL     PLAN:  PT FREQUENCY: 1x/week  PT DURATION: POC date  PLANNED INTERVENTIONS: 97164- PT Re-evaluation, 97750- Physical Performance Testing, 97110-Therapeutic exercises, 97530- Therapeutic activity, 97112- Neuromuscular re-education, 97535- Self Care, 02859- Manual therapy, [id number]- Gait training, [phone]- Aquatic Therapy, [phone] (1-2 muscles), 20561 (3+ muscles)- Dry Needling, Patient/Family education, Balance training, Stair training, Taping, Joint mobilization, Spinal mobilization, Scar mobilization, and Cryotherapy.  PLAN FOR NEXT SESSION: per protocol, WBAT; now 5 weeks out as of 04/19/24    Prentice RAMAN Wetona Viramontes, PT 05/10/2024, 10:59 AM     "

## 2024-05-17 ENCOUNTER — Ambulatory Visit (HOSPITAL_BASED_OUTPATIENT_CLINIC_OR_DEPARTMENT_OTHER): Attending: Orthopaedic Surgery | Admitting: Physical Therapy

## 2024-05-17 ENCOUNTER — Encounter (HOSPITAL_BASED_OUTPATIENT_CLINIC_OR_DEPARTMENT_OTHER): Payer: Self-pay | Admitting: Physical Therapy

## 2024-05-17 DIAGNOSIS — M25552 Pain in left hip: Secondary | ICD-10-CM | POA: Insufficient documentation

## 2024-05-17 DIAGNOSIS — R262 Difficulty in walking, not elsewhere classified: Secondary | ICD-10-CM | POA: Diagnosis present

## 2024-05-17 DIAGNOSIS — M6281 Muscle weakness (generalized): Secondary | ICD-10-CM | POA: Diagnosis present

## 2024-05-17 NOTE — Therapy (Signed)
 " OUTPATIENT PHYSICAL THERAPY TREATMENT   Patient Name: Kristine Graves MRN: [id number] DOB:08-Jan-1986, 39 y.o., female Today's Date: 05/17/2024  END OF SESSION:  PT End of Session - 05/17/24 0935     Visit Number 9    Number of Visits 13    Date for Recertification  06/11/24    Authorization Type BCBS    Authorization Time Period 15 visits approved  From 11.06.2025 - 02.04.2026    Authorization - Visit Number 9    Authorization - Number of Visits 15    PT Start Time 0935    PT Stop Time 1015    PT Time Calculation (min) 40 min    Activity Tolerance Patient tolerated treatment well    Behavior During Therapy [facility] for tasks assessed/performed           Past Medical History:  Diagnosis Date   Abnormal maternal serum screening test 06/07/2015   1:39 Down syndrome risk from first trimester screening- NIPS drawn 06/07/15     Acute appendicitis 06/09/2019   Anxiety    Blood transfusion without reported diagnosis    at birth RH incompatibility   Depression    Elevated liver enzymes 11/30/2021   High risk pregnancy with low PAPPA 06/07/2015   History of appendectomy 11/30/2021   Hypercalcemia 11/30/2021   Hyponatremia 11/30/2021   Indication for care in labor or delivery 07/13/2018   Metabolic acidosis, increased anion gap 11/30/2021   Polycythemia 11/30/2021   SBO (small bowel obstruction) (HCC) 11/30/2021   SVD (spontaneous vaginal delivery) 07/13/2018   Past Surgical History:  Procedure Laterality Date   BREAST ENHANCEMENT SURGERY     LAPAROSCOPIC APPENDECTOMY N/A 06/09/2019   Procedure: APPENDECTOMY LAPAROSCOPIC;  Surgeon: Tanda Locus, MD;  Location[facility] OR;  Service: General;  Laterality: N/A;   NASAL SINUS SURGERY     Patient Active Problem List   Diagnosis Date Noted   Tear of left acetabular labrum 03/15/2024   Alcohol use disorder, severe, in early remission (HCC) 06/29/2023   Generalized anxiety disorder 06/29/2023   Recurrent major depressive disorder, in  remission 11/30/2021     REFERRING PROVIDER:  Genelle Standing, MD    REFERRING DIAG:  [phone] (ICD-10-CM) - Tear of left acetabular labrum, initial encounter     S/p PROCEDURE: 1. Left hip labral repair  Rationale for Evaluation and Treatment: Rehabilitation  THERAPY DIAG:  Muscle weakness (generalized)  Pain in left hip  Difficulty in walking, not elsewhere classified  ONSET DATE: DOS 03/15/24   SUBJECTIVE:  SUBJECTIVE STATEMENT:  Pt reports hamstrings are sore and some soreness at upper glutes.   PERTINENT HISTORY:  N/a  PAIN:  Are you having pain? No, rest, walking around/gentle movement   PRECAUTIONS:  None  RED FLAGS: None   WEIGHT BEARING RESTRICTIONS:  WBAT  FALLS:  Has patient fallen in last 6 months? 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  PLOF:  Independent  PATIENT GOALS:  Daily workouts- walking, barre 3, kickboxing, working with a trainer; improve hip stability   OBJECTIVE:  Note: Objective measures were completed at Evaluation unless otherwise noted.  PATIENT SURVEYS:  LEFS  Extreme difficulty/unable (0), Quite a bit of difficulty (1), Moderate difficulty (2), Little difficulty (3), No difficulty (4) Survey date:  03/17/24 04/19/24  Any of your usual work, housework or school activities 3   2. Usual hobbies, recreational or sporting activities 1   3. Getting into/out of the bath 2   4. Walking between rooms 4   5. Putting on socks/shoes 3   6. Squatting  2   7. Lifting an object, like a bag of groceries from the floor 2   8. Performing light activities around your home 3   9. Performing heavy activities around  your home 1   10. Getting into/out of a car 2   11. Walking 2 blocks 2   12. Walking 1 mile 1   13. Going up/down 10 stairs (1 flight) 1   14. Standing for 1 hour 1   15.  sitting for 1 hour 2   16. Running on even ground 0   17. Running on uneven ground 0   18. Making sharp turns while running fast 0   19. Hopping  0   20. Rolling over in bed 2   Score total:  32/80 66/80     COGNITIVE STATUS: Within functional limits for tasks assessed   SENSATION: WFL  EDEMA:  No   GAIT: Comments: EVAL  arrived without AD, lacking hip ext as expected, upright posture, good heel-toe pattern.    Body Part #1 Hip  PALPATION: EVAL: soft end feel without increase in pain  LOWER EXTREMITY ROM:     Passive  Left eval 04/19/24 L  Hip flexion 90 90 AROM no pain   Hip extension 0    (Blank rows = not tested)   TREATMENT DATE:  05/17/24 Elliptical 5 minutes level 7 for dynamic warm up Supine active hamstring stretch 5 x 10 second holds STM to L glute Forward step down 6 inch 2 x 10 SL RDL 1 x 10, 5# 2 x 10 Mini squat 3 way slide out 2 x 5 Curtsy lunge 2 x 10 Split squat 2 x 12   05/10/24 Elliptical 5 minutes level 5 for dynamic warm up Lateral step down 6 inch 3 x 10 Forward step down 6 inch 2 x 10 Dead lift 10# kb 2 x 15 SL RDL 2 x 10 Mini squat 3 way slide out 2 x 5 Split squat 2 x 12   12/23 Sci-fit L4 x46min Long sit HSS 30sec x3 PROM L hip Cook bridge 2x10 L HSC with red physioball 2x10 S/l hip abduction taps ant/post 2x15ea   04/19/24  Scifit bike L3x8 minutes for w/u  Shuttle BLE press 75# 2x12 Shuttle L LE press 37# 2x12   Hip  ROM, LEFS, SLS, 30s STS test, goals and education on progress with PT  Standing hip ABD yellow TB 2x10 Mini squats at bar yellow TB 2x10  Forward step ups 4 inch step x12 L  HS stretch with sheet 2x30 seconds B    04/12/24 Life cycle bike x5 min  STM and trigger point release to glutes  Mini squats 2x10 Standing HS curl  2x10 Supine active hamstring stretch 2x10 with 5 second holds  Lateral walks 2x5 laps at rail LAQ 5# weight 3x10   11/25 Upright bike x 6 mins lvl 0  Pt received R hip PROM in flex, abduction, ER, and circumduction in supine and IR in prone per pt and tissue tolerance w/n protocol ranges in supine.    Prone knee flexion x20 Quadruped arm lifts with TrA 2x10 Supine bridge 2x10 Hooklying bent knee fall outs with TrA w/n protocol ranges 2x10   03/30/24 Life cycle bike x6 min  Glute bridges 2x10 Bird dogs 3x10 Tall kneeling on airex pad, row with blue TB x10  Half-kneeling D2 shoulder flexion with RTB x10, GTB 2x10 bilaterally  Tall kneeling palloff press blue TB 2x10 bilaterally  03/24/24 Bandage change  Adductor squeezes 2x10  Glute bridges 3x10 Side lying hip abduction 3x10 Prone hip extension 3x10 Quadruped rocking x10   EVAL Changed bandages See HEP   PATIENT EDUCATION:  Education details: Anatomy of condition, POC, HEP, exercise form/rationale 05/17/24 HEP Person educated: Patient Education method: Explanation, Demonstration, Tactile cues, Verbal cues, and Handouts Education comprehension: verbalized understanding, returned demonstration, verbal cues required, tactile cues required, and needs further education    HOME EXERCISE PROGRAM: Access Code: [email or web address] URL: [email or web address]   ASSESSMENT:  CLINICAL IMPRESSION:  Began session elliptical for dynamic warm up. Continued with quad, glute, and functional strengthening with progressions as able. Good mechanics after initial cueing. She is progressing very well, will cancel next appointment and patient will work on HEP and her workouts and she will return in  a couple of weeks for likely d/c. Patient will continue to benefit from physical therapy in order to improve function and reduce impairment.     REHAB POTENTIAL: Good  CLINICAL DECISION MAKING: Stable/uncomplicated  EVALUATION  COMPLEXITY: Low   GOALS: Goals reviewed with patient? Yes  SHORT TERM GOALS: Target date: 4 weeks 04/09/24  Pain free flexion to 90 Baseline: Goal status: MET 12/9  2.  Demo controlled quad set with hold Baseline:  Goal status: MET 12/9  3.  30s sit to stand without pain or compensation Baseline:  Goal status: ONGOING 12/9- mild compensations and pain, 9 reps in 30 seconds   4.  SLS 30s without compensation Baseline:  Goal status: MET 12/9    LONG TERM GOALS:  Able to demo step up on 6 step with level pelvis and proper form Baseline:  Goal status: INITIAL Week 8 05/07/24  2.  Will tolerate at least 3 min on elliptical, demonstrating good tolerance to repetitive weight bearing motion Baseline:  Goal status: INITIAL  Week 8 05/07/24  3.  Demonstrate proper form in at least 10 continuous lunges without increased pain Baseline:  Goal status: INITIAL  Week 12 06/04/24  4.  Demonstrate gentle, double and single foot plyometric motions with good proximal form Baseline:  Goal status: INITIAL Week 12 06/04/24  5.  Demonstrate stability for return to barre exercises without limitation by pain Baseline:  Goal status: INITIAL     PLAN:  PT FREQUENCY: 1x/week  PT DURATION: POC  date  PLANNED INTERVENTIONS: 97164- PT Re-evaluation, 97750- Physical Performance Testing, 97110-Therapeutic exercises, 97530- Therapeutic activity, [id number]- Neuromuscular re-education, [phone]- Self Care, 02859- Manual therapy, [phone]- Gait training, [phone]- Aquatic Therapy, [phone] (1-2 muscles), 20561 (3+ muscles)- Dry Needling, Patient/Family education, Balance training, Stair training, Taping, Joint mobilization, Spinal mobilization, Scar mobilization, and Cryotherapy.  PLAN FOR NEXT SESSION: per protocol    Prentice GORMAN Stains, PT, DPT 05/17/2024, 10:13 AM     "

## 2024-05-24 ENCOUNTER — Encounter (HOSPITAL_BASED_OUTPATIENT_CLINIC_OR_DEPARTMENT_OTHER)

## 2024-05-31 ENCOUNTER — Encounter (HOSPITAL_BASED_OUTPATIENT_CLINIC_OR_DEPARTMENT_OTHER): Payer: Self-pay | Admitting: Physical Therapy

## 2024-05-31 ENCOUNTER — Ambulatory Visit (HOSPITAL_BASED_OUTPATIENT_CLINIC_OR_DEPARTMENT_OTHER): Admitting: Physical Therapy

## 2024-05-31 DIAGNOSIS — M25552 Pain in left hip: Secondary | ICD-10-CM

## 2024-05-31 DIAGNOSIS — R262 Difficulty in walking, not elsewhere classified: Secondary | ICD-10-CM

## 2024-05-31 DIAGNOSIS — M6281 Muscle weakness (generalized): Secondary | ICD-10-CM | POA: Diagnosis not present

## 2024-05-31 NOTE — Therapy (Signed)
 " OUTPATIENT PHYSICAL THERAPY TREATMENT   Patient Name: Kristine Graves MRN: [id number] DOB:1985/05/19, 39 y.o., female Today's Date: 05/31/2024 PHYSICAL THERAPY DISCHARGE SUMMARY  Visits from Start of Care: 10  Current functional level related to goals / functional outcomes: See below   Remaining deficits: See below   Education / Equipment: See below   Patient agrees to discharge. Patient goals were met. Patient is being discharged due to meeting the stated rehab goals.   Progress Note   Reporting Period 03/17/24 to 05/31/24   See note below for Objective Data and Assessment of Progress/Goals   END OF SESSION:  PT End of Session - 05/31/24 0933     Visit Number 10    Number of Visits 13    Date for Recertification  06/11/24    Authorization Type [employer]    Authorization Time Period 15 visits approved  From 11.06.2025 - 02.04.2026    Authorization - Visit Number 10    Authorization - Number of Visits 15    PT Start Time 0933    PT Stop Time 1004    PT Time Calculation (min) 31 min    Activity Tolerance Patient tolerated treatment well    Behavior During Therapy [facility] for tasks assessed/performed           Past Medical History:  Diagnosis Date   Abnormal maternal serum screening test 06/07/2015   1:39 Down syndrome risk from first trimester screening- NIPS drawn 06/07/15     Acute appendicitis 06/09/2019   Anxiety    Blood transfusion without reported diagnosis    at birth RH incompatibility   Depression    Elevated liver enzymes 11/30/2021   High risk pregnancy with low PAPPA 06/07/2015   History of appendectomy 11/30/2021   Hypercalcemia 11/30/2021   Hyponatremia 11/30/2021   Indication for care in labor or delivery 07/13/2018   Metabolic acidosis, increased anion gap 11/30/2021   Polycythemia 11/30/2021   SBO (small bowel obstruction) (HCC) 11/30/2021   SVD (spontaneous vaginal delivery) 07/13/2018   Past Surgical History:  Procedure Laterality Date    BREAST ENHANCEMENT SURGERY     LAPAROSCOPIC APPENDECTOMY N/A 06/09/2019   Procedure: APPENDECTOMY LAPAROSCOPIC;  Surgeon: Tanda Locus, MD;  Location: [facility] OR;  Service: General;  Laterality: N/A;   NASAL SINUS SURGERY     Patient Active Problem List   Diagnosis Date Noted   Tear of left acetabular labrum 03/15/2024   Alcohol use disorder, severe, in early remission (HCC) 06/29/2023   Generalized anxiety disorder 06/29/2023   Recurrent major depressive disorder, in remission 11/30/2021     REFERRING PROVIDER:  Genelle Standing, MD    REFERRING DIAG:  [phone] (ICD-10-CM) - Tear of left acetabular labrum, initial encounter     S/p PROCEDURE: 1. Left hip labral repair  Rationale for Evaluation and Treatment: Rehabilitation  THERAPY DIAG:  Muscle weakness (generalized)  Pain in left hip  Difficulty in walking, not elsewhere classified  ONSET DATE: DOS 03/15/24   SUBJECTIVE:  SUBJECTIVE STATEMENT:  Pt reports doing well. Doing HEP without issue. Patient states 90% improvement/functional status. Remaining deficit is a little bit of soreness.    PERTINENT HISTORY:  N/a  PAIN:  Are you having pain? No, rest, walking around/gentle movement   PRECAUTIONS:  None  RED FLAGS: None   WEIGHT BEARING RESTRICTIONS:  WBAT  FALLS:  Has patient fallen in last 6 months? 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  PLOF:  Independent  PATIENT GOALS:  Daily workouts- walking, barre 3, kickboxing, working with a trainer; improve hip stability   OBJECTIVE:  Note: Objective measures were completed at Evaluation unless otherwise noted.  PATIENT SURVEYS:   LEFS  Extreme difficulty/unable (0), Quite a bit of difficulty (1), Moderate difficulty (2), Little difficulty (3), No difficulty (4) Survey date:  03/17/24 04/19/24 05/31/24  Any of your usual work, housework or school activities 3    2. Usual hobbies, recreational or sporting activities 1    3. Getting into/out of the bath 2    4. Walking between rooms 4    5. Putting on socks/shoes 3    6. Squatting  2    7. Lifting an object, like a bag of groceries from the floor 2    8. Performing light activities around your home 3    9. Performing heavy activities around your home 1    10. Getting into/out of a car 2    11. Walking 2 blocks 2    12. Walking 1 mile 1    13. Going up/down 10 stairs (1 flight) 1    14. Standing for 1 hour 1    15.  sitting for 1 hour 2    16. Running on even ground 0    17. Running on uneven ground 0    18. Making sharp turns while running fast 0    19. Hopping  0    20. Rolling over in bed 2    Score total:  32/80 66/80 80/80      COGNITIVE STATUS: Within functional limits for tasks assessed   SENSATION: WFL  EDEMA:  No   GAIT: Comments: EVAL  arrived without AD, lacking hip ext as expected, upright posture, good heel-toe pattern.    Body Part #1 Hip  PALPATION: EVAL: soft end feel without increase in pain  LOWER EXTREMITY ROM:     Passive  Left eval 04/19/24 L  Hip flexion 90 90 AROM no pain   Hip extension 0    (Blank rows = not tested) LOWER EXTREMITY  ROM:  WFL all planes 05/31/24    LOWER EXTREMITY MMT:  MMT Right 05/31/2024 Left 05/31/2024  Hip flexion 5 5  Hip extension 5 5  Hip abduction 5 5  Hip adduction    Hip internal rotation    Hip external rotation    Knee flexion 5 5  Knee extension 5 5  Ankle dorsiflexion    Ankle plantarflexion    Ankle inversion    Ankle eversion     (Blank rows = not tested) *= pain Reassessment 05/31/24: 30 second STS: 13 reps Forward step down test: good mechanics bilaterally Stairs:  7 inch alternating, WFL Light plyos: squat jumps WFL   TREATMENT DATE:  05/31/24 Elliptical 5 minutes level 7 for dynamic warm up Reassessment Demonstration of hip flexor stretches  05/17/24 Elliptical 5 minutes level 7 for dynamic warm up Supine active hamstring stretch 5 x 10 second holds STM to L glute Forward step down 6 inch 2 x 10 SL RDL 1 x 10, 5# 2 x 10 Mini squat 3 way slide out 2 x 5 Curtsy lunge 2 x 10 Split squat 2 x 12   05/10/24 Elliptical 5 minutes level 5 for dynamic warm up Lateral step down 6 inch 3 x 10 Forward step down 6 inch 2 x 10 Dead lift 10# kb 2 x 15 SL RDL 2 x 10 Mini squat 3 way slide out 2 x 5 Split squat 2 x 12   12/23 Sci-fit L4 x72min Long sit HSS 30sec x3 PROM L hip Cook bridge 2x10 L HSC with red physioball 2x10 S/l hip abduction taps ant/post 2x15ea   04/19/24  Scifit bike L3x8 minutes for w/u  Shuttle BLE press 75# 2x12 Shuttle L LE press 37# 2x12   Hip ROM, LEFS, SLS, 30s STS test, goals and education on progress with PT  Standing hip ABD yellow TB 2x10 Mini squats at bar yellow TB 2x10  Forward step ups 4 inch step x12 L  HS stretch with sheet 2x30 seconds B    04/12/24 Life cycle bike x5 min  STM and trigger point release to glutes  Mini squats 2x10 Standing HS curl 2x10 Supine active hamstring stretch 2x10 with 5 second holds  Lateral walks 2x5 laps at rail LAQ 5# weight 3x10   11/25 Upright bike x 6 mins lvl 0  Pt received R hip PROM in flex, abduction, ER, and circumduction in supine and IR in prone per pt and tissue tolerance w/n protocol ranges in supine.    Prone knee flexion x20 Quadruped arm lifts with TrA 2x10 Supine bridge 2x10 Hooklying bent knee fall outs with TrA w/n protocol ranges 2x10   03/30/24 Life cycle bike x6 min  Glute bridges 2x10 Bird dogs 3x10 Tall kneeling on airex pad, row with blue TB x10  Half-kneeling D2 shoulder flexion with RTB x10, GTB 2x10 bilaterally  Tall kneeling  palloff press blue TB 2x10 bilaterally  03/24/24 Bandage change  Adductor squeezes 2x10  Glute bridges 3x10 Side lying hip abduction 3x10 Prone hip extension 3x10 Quadruped rocking x10   EVAL Changed bandages See HEP   PATIENT EDUCATION:  Education details: Anatomy of condition, POC, HEP, exercise form/rationale 05/17/24 HEP 05/31/24: reassessment findings, POC, HEP Person educated: Patient Education method: Explanation, Demonstration, Tactile cues, Verbal cues, and Handouts Education comprehension: verbalized understanding, returned demonstration, verbal cues required, tactile cues required, and needs further education    HOME EXERCISE PROGRAM: Access Code: [email or web address] URL: [email or web address]   ASSESSMENT:  CLINICAL IMPRESSION:  Patient has met 4/4 short term goals and 5/5 long term goals with ability to complete HEP and improvement in symptoms, strength, ROM, activity tolerance, gait, balance, and functional mobility. Patient is overall doing very well without limitation. Discussed reassessment findings, continued HEP, and returning to PT if needed. Patient discharged from PT.      REHAB POTENTIAL: Good  CLINICAL DECISION MAKING: Stable/uncomplicated  EVALUATION COMPLEXITY: Low   GOALS: Goals reviewed with patient? Yes  SHORT TERM GOALS: Target date: 4 weeks 04/09/24  Pain free flexion to 90 Baseline: Goal status: MET 12/9  2.  Demo controlled quad set with hold Baseline:  Goal status: MET 12/9  3.  30s sit to stand without pain or compensation Baseline:  Goal status: 12/9- mild compensations and pain, 9  reps in 30 seconds; 05/31/24: MET 13 reps    4.  SLS 30s without compensation Baseline:  Goal status: MET 12/9    LONG TERM GOALS:  Able to demo step up on 6 step with level pelvis and proper form Baseline:  Goal status: MET 05/31/24  2.  Will tolerate at least 3 min on elliptical, demonstrating good tolerance to repetitive weight  bearing motion Baseline:  Goal status: MET 05/31/24  3.  Demonstrate proper form in at least 10 continuous lunges without increased pain Baseline:  Goal status: MET 05/31/24  4.  Demonstrate gentle, double and single foot plyometric motions with good proximal form Baseline:  Goal status:  MET 05/31/24  5.  Demonstrate stability for return to barre exercises without limitation by pain Baseline:  Goal status:  MET 05/31/24    PLAN:  PT FREQUENCY: 1x/week  PT DURATION: POC date  PLANNED INTERVENTIONS: 97164- PT Re-evaluation, 97750- Physical Performance Testing, 97110-Therapeutic exercises, 97530- Therapeutic activity, 97112- Neuromuscular re-education, 97535- Self Care, [id number]- Manual therapy, [phone]- Gait training, [phone]- Aquatic Therapy, [phone] (1-2 muscles), [phone] (3+ muscles)- Dry Needling, Patient/Family education, Balance training, Stair training, Taping, Joint mobilization, Spinal mobilization, Scar mobilization, and Cryotherapy.  PLAN FOR NEXT SESSION: n/a    Prentice RAMAN Jerson Furukawa, PT, DPT 05/31/2024, 10:10 AM     "

## 2024-06-24 ENCOUNTER — Encounter (HOSPITAL_COMMUNITY): Admitting: Student
